# Patient Record
Sex: Female | Born: 1950 | Race: White | Hispanic: No | Marital: Married | Smoking: Former smoker
Health system: Southern US, Community
[De-identification: ages and names within clinical notes are randomized; demographics above are authoritative.]

## PROBLEM LIST (undated history)

## (undated) DIAGNOSIS — J449 Chronic obstructive pulmonary disease, unspecified: Secondary | ICD-10-CM

## (undated) DIAGNOSIS — I639 Cerebral infarction, unspecified: Secondary | ICD-10-CM

## (undated) DIAGNOSIS — I10 Essential (primary) hypertension: Secondary | ICD-10-CM

## (undated) DIAGNOSIS — K219 Gastro-esophageal reflux disease without esophagitis: Secondary | ICD-10-CM

## (undated) DIAGNOSIS — F3181 Bipolar II disorder: Secondary | ICD-10-CM

## (undated) DIAGNOSIS — F0390 Unspecified dementia without behavioral disturbance: Secondary | ICD-10-CM

## (undated) DIAGNOSIS — R29898 Other symptoms and signs involving the musculoskeletal system: Secondary | ICD-10-CM

## (undated) DIAGNOSIS — F209 Schizophrenia, unspecified: Secondary | ICD-10-CM

## (undated) DIAGNOSIS — T753XXA Motion sickness, initial encounter: Secondary | ICD-10-CM

## (undated) DIAGNOSIS — Z85820 Personal history of malignant melanoma of skin: Secondary | ICD-10-CM

## (undated) DIAGNOSIS — E119 Type 2 diabetes mellitus without complications: Secondary | ICD-10-CM

## (undated) HISTORY — DX: Gastro-esophageal reflux disease without esophagitis: K21.9

## (undated) HISTORY — DX: Cerebral infarction, unspecified: I63.9

## (undated) HISTORY — PX: ABDOMINAL HYSTERECTOMY: SHX81

## (undated) HISTORY — PX: COLON SURGERY: SHX602

---

## 2015-04-10 ENCOUNTER — Ambulatory Visit
Admit: 2015-04-10 | Disposition: A | Discharge status: Home / Self Care | Payer: Self-pay | Attending: Family Medicine | Admitting: Family Medicine

## 2015-05-01 ENCOUNTER — Ambulatory Visit
Admission: RE | Admit: 2015-05-01 | Discharge: 2015-05-01 | Disposition: A | Discharge status: Home / Self Care | Payer: Medicare PPO | Source: Ambulatory Visit | Attending: Family Medicine | Admitting: Family Medicine

## 2015-05-01 ENCOUNTER — Other Ambulatory Visit: Payer: Self-pay | Admitting: Family Medicine

## 2015-05-01 DIAGNOSIS — J449 Chronic obstructive pulmonary disease, unspecified: Secondary | ICD-10-CM | POA: Insufficient documentation

## 2015-05-01 DIAGNOSIS — R05 Cough: Secondary | ICD-10-CM

## 2015-05-01 DIAGNOSIS — R059 Cough, unspecified: Secondary | ICD-10-CM

## 2018-09-29 LAB — HM MAMMOGRAPHY

## 2019-01-19 ENCOUNTER — Ambulatory Visit
Admission: EM | Admit: 2019-01-19 | Discharge: 2019-01-19 | Disposition: A | Discharge status: Home / Self Care | Payer: Medicare (Managed Care) | Attending: Family Medicine | Admitting: Family Medicine

## 2019-01-19 ENCOUNTER — Encounter: Payer: Self-pay | Admitting: Emergency Medicine

## 2019-01-19 ENCOUNTER — Other Ambulatory Visit: Payer: Self-pay

## 2019-01-19 ENCOUNTER — Ambulatory Visit (INDEPENDENT_AMBULATORY_CARE_PROVIDER_SITE_OTHER): Payer: Medicare (Managed Care)

## 2019-01-19 DIAGNOSIS — Z87891 Personal history of nicotine dependence: Secondary | ICD-10-CM

## 2019-01-19 DIAGNOSIS — J441 Chronic obstructive pulmonary disease with (acute) exacerbation: Secondary | ICD-10-CM | POA: Diagnosis not present

## 2019-01-19 DIAGNOSIS — R6883 Chills (without fever): Secondary | ICD-10-CM

## 2019-01-19 DIAGNOSIS — R05 Cough: Secondary | ICD-10-CM | POA: Diagnosis not present

## 2019-01-19 DIAGNOSIS — R0989 Other specified symptoms and signs involving the circulatory and respiratory systems: Secondary | ICD-10-CM

## 2019-01-19 DIAGNOSIS — R059 Cough, unspecified: Secondary | ICD-10-CM

## 2019-01-19 HISTORY — DX: Essential (primary) hypertension: I10

## 2019-01-19 HISTORY — DX: Unspecified dementia, unspecified severity, without behavioral disturbance, psychotic disturbance, mood disturbance, and anxiety: F03.90

## 2019-01-19 HISTORY — DX: Chronic obstructive pulmonary disease, unspecified: J44.9

## 2019-01-19 HISTORY — DX: Personal history of malignant melanoma of skin: Z85.820

## 2019-01-19 HISTORY — DX: Schizophrenia, unspecified: F20.9

## 2019-01-19 HISTORY — DX: Bipolar II disorder: F31.81

## 2019-01-19 MED ORDER — DOXYCYCLINE HYCLATE 100 MG PO CAPS: 100.0000 mg | ORAL_CAPSULE | Freq: Two times a day (BID) | ORAL | 0 refills | Status: DC

## 2019-01-19 MED ORDER — PREDNISONE 20 MG PO TABS: ORAL_TABLET | ORAL | 0 refills | Status: DC

## 2019-01-19 MED ORDER — ALBUTEROL SULFATE HFA 108 (90 BASE) MCG/ACT IN AERS: 1.0000 | INHALATION_SPRAY | Freq: Four times a day (QID) | RESPIRATORY_TRACT | 0 refills | Status: AC | PRN

## 2019-01-19 NOTE — ED Provider Notes (Signed)
[facility]    CSN: [id number] Arrival date & time: 01/19/19  1055     History   Chief Complaint Chief Complaint  Patient presents with  . Cough    APPT    HPI Lorraine Vargas is a 68 y.o. female.   HPI  68 year old female presents with a cough that she has had for 1 month.  Now she is complaining also of sinus congestion and drainage.  Cough has been productive of brownish sputum.  She recently quit smoking about 4 months ago.  She has been taking over-the-counter Sudafed and Robitussin without much success.  Her psychiatrist on 01/15/2019 and they prescribed Tessalon Perles and Zyrtec.  Is a history of COPD.  Sure a 98.9 respirations 18 O2 sats 97%.        Past Medical History:  Diagnosis Date  . Bipolar 2 disorder ([place])   . COPD (chronic obstructive pulmonary disease) ([place])   . Dementia ([place])   . History of melanoma   . Hypertension   . Schizophrenia ([place])     There are no active problems to display for this patient.   Past Surgical History:  Procedure Laterality Date  . ABDOMINAL HYSTERECTOMY    . COLON SURGERY      OB History   No obstetric history on file.      Home Medications    Prior to Admission medications   Medication Sig Start Date End Date Taking? Authorizing Provider  ARIPiprazole (ABILIFY) 5 MG tablet Take by mouth. 09/15/18  Yes [provider]  atenolol (TENORMIN) 50 MG tablet Take by mouth. 05/28/18 05/28/19 Yes [provider]  benzonatate (TESSALON) 100 MG capsule Take by mouth at bedtime as needed for cough.   Yes [provider]  buPROPion (WELLBUTRIN XL) 150 MG 24 hr tablet Take by mouth. 07/10/18 07/10/19 Yes [provider]  cetirizine (ZYRTEC) 10 MG tablet Take 10 mg by mouth daily.   Yes [provider]  Dextromethorphan-quiNIDine (NUEDEXTA) 20-10 MG capsule Take 1 capsule by mouth daily. Will increase to bid on 01/22/19   Yes [provider]  losartan (COZAAR) 50  MG tablet Take by mouth. 11/11/18 11/11/19 Yes [provider]  memantine (NAMENDA) 5 MG tablet Take by mouth. 09/17/18  Yes [provider]  mirtazapine (REMERON) 15 MG tablet Mirtazapine 15 MG Oral Tablet   Yes [provider]  rosuvastatin (CRESTOR) 20 MG tablet Take by mouth. 10/15/18 2019/11/12 Yes [provider]  albuterol (PROVENTIL HFA;VENTOLIN HFA) 108 (90 Base) MCG/ACT inhaler Inhale 1-2 puffs into the lungs every 6 (six) hours as needed for wheezing or shortness of breath. Use with spacer 01/19/19   Lorin Picket, PA-C  doxycycline (VIBRAMYCIN) 100 MG capsule Take 1 capsule (100 mg total) by mouth 2 (two) times daily. 01/19/19   Lorin Picket, PA-C  predniSONE (DELTASONE) 20 MG tablet Take 2 tablets (40 mg) daily by mouth 01/19/19   Lorin Picket, PA-C    Family History Family History  Problem Relation Age of Onset  . Dementia Mother   . Congestive Heart Failure Father   . Diabetes Father     Social History Social History   Tobacco Use  . Smoking status: Former Smoker    Last attempt to quit: 09/18/2018    Years since quitting: 0.3  . Smokeless tobacco: Never Used  Substance Use Topics  . Alcohol use: Never    Frequency: Never  . Drug use: Never  Allergies   Amlodipine; Penicillin g; and Lisinopril   Review of Systems Review of Systems  Constitutional: Positive for activity change. Negative for chills, fatigue and fever.  HENT: Positive for congestion, sinus pressure and sinus pain.   Respiratory: Positive for cough and shortness of breath.   All other systems reviewed and are negative.    Physical Exam Triage Vital Signs ED Triage Vitals  Enc Vitals Group     BP 01/19/19 1126 (!) 193/74     Pulse Rate 01/19/19 1126 76     Resp 01/19/19 1126 18     Temp 01/19/19 1126 98.9 F (37.2 C)     Temp Source 01/19/19 1126 Oral     SpO2 01/19/19 1126 97 %     Weight 01/19/19 1127 124 lb (56.2 kg)     Height 01/19/19  1127 5\' 2"  (1.575 m)     Head Circumference --      Peak Flow --      Pain Score 01/19/19 1127 0     Pain Loc --      Pain Edu? --      Excl. in [place]? --    No data found.  Updated Vital Signs BP (!) 193/74 (BP Location: Right Arm)   Pulse 76   Temp 98.9 F (37.2 C) (Oral)   Resp 18   Ht 5\' 2"  (1.575 m)   Wt 124 lb (56.2 kg)   SpO2 97%   BMI 22.68 kg/m   Visual Acuity Right Eye Distance:   Left Eye Distance:   Bilateral Distance:    Right Eye Near:   Left Eye Near:    Bilateral Near:     Physical Exam Vitals signs and nursing note reviewed.  Constitutional:      General: She is not in acute distress.    Appearance: Normal appearance. She is not ill-appearing, toxic-appearing or diaphoretic.  HENT:     Head: Normocephalic and atraumatic.     Right Ear: Tympanic membrane, ear canal and external ear normal.     Left Ear: Tympanic membrane, ear canal and external ear normal.     Nose: Congestion present. No rhinorrhea.     Mouth/Throat:     Mouth: Mucous membranes are moist.     Pharynx: No oropharyngeal exudate or posterior oropharyngeal erythema.  Eyes:     Conjunctiva/sclera: Conjunctivae normal.  Neck:     Musculoskeletal: Normal range of motion and neck supple.  Pulmonary:     Effort: Pulmonary effort is normal.     Breath sounds: Rales present.     Comments: Bibasilar non-tussive crackles Musculoskeletal: Normal range of motion.  Lymphadenopathy:     Cervical: No cervical adenopathy.  Skin:    General: Skin is warm and dry.  Neurological:     General: No focal deficit present.     Mental Status: She is alert and oriented to person, place, and time.  Psychiatric:        Mood and Affect: Mood normal.        Behavior: Behavior normal.        Thought Content: Thought content normal.        Judgment: Judgment normal.      UC Treatments / Results  Labs (all labs ordered are listed, but only abnormal results are displayed) Labs Reviewed - No data to  display  EKG None  Radiology Dg Chest 2 View  Result Date: 01/19/2019 CLINICAL DATA:  Pt states she has been sick  x 2-3 weeks with upper chest tightness, runny nose, prod cough, chills. Hx exsmoker and bronchitis EXAM: CHEST - 2 VIEW COMPARISON:  05/01/2015 FINDINGS: Lungs clear, mildly hyperinflated. Heart size and mediastinal contours are within normal limits. Aortic Atherosclerosis (ICD10-170.0). No effusion. Visualized bones unremarkable. IMPRESSION: No acute cardiopulmonary disease. Electronically Signed   By: Lucrezia Europe M.D.   On: 01/19/2019 12:30    Procedures Procedures (including critical care time)  Medications Ordered in UC Medications - No data to display  Initial Impression / Assessment and Plan / UC Course  I have reviewed the triage vital signs and the nursing notes.  Pertinent labs & imaging results that were available during my care of the patient were reviewed by me and considered in my medical decision making (see chart for details).   Chronic cough with no findings of pneumonia but does have mild bronchitic changes. Place her on albuterol inhalers given a prescription for prednisone that I want her to clear with her psychiatrist before taking.  Place her on doxycycline.  Commended that she follow-up with her primary care physician or pulmonologist for further evaluation and ongoing care.   Final Clinical Impressions(s) / UC Diagnoses   Final diagnoses:  COPD exacerbation ([place])  Cough   Discharge Instructions   None    ED Prescriptions    Medication Sig Dispense Auth. Provider   predniSONE (DELTASONE) 20 MG tablet Take 2 tablets (40 mg) daily by mouth 8 tablet Crecencio Mc P, PA-C   doxycycline (VIBRAMYCIN) 100 MG capsule Take 1 capsule (100 mg total) by mouth 2 (two) times daily. 14 capsule Crecencio Mc P, PA-C   albuterol (PROVENTIL HFA;VENTOLIN HFA) 108 (90 Base) MCG/ACT inhaler Inhale 1-2 puffs into the lungs every 6 (six) hours as needed for  wheezing or shortness of breath. Use with spacer 1 Inhaler Lorin Picket, PA-C     Controlled Substance Prescriptions [place] Controlled Substance Registry consulted? Not Applicable   Lorin Picket, PA-C 01/19/19 1352

## 2019-01-19 NOTE — ED Triage Notes (Signed)
Patient in today c/o cough x 1 month and now sinus congestion. Patient denies fever. Patient has tried OTC Sudafed and Robitussin. Patient also saw psych on 01/15/19 and they gave Tessalon Perles and Zyrtec.

## 2019-05-28 ENCOUNTER — Encounter: Payer: Self-pay | Admitting: Family Medicine

## 2019-05-28 ENCOUNTER — Other Ambulatory Visit: Payer: Self-pay

## 2019-05-28 ENCOUNTER — Ambulatory Visit (INDEPENDENT_AMBULATORY_CARE_PROVIDER_SITE_OTHER): Payer: Medicare Other | Admitting: Family Medicine

## 2019-05-28 VITALS — BP 165/85 | HR 83 | Temp 98.6°F | Ht 62.5 in | Wt 137.0 lb

## 2019-05-28 DIAGNOSIS — J449 Chronic obstructive pulmonary disease, unspecified: Secondary | ICD-10-CM | POA: Insufficient documentation

## 2019-05-28 DIAGNOSIS — E7800 Pure hypercholesterolemia, unspecified: Secondary | ICD-10-CM

## 2019-05-28 DIAGNOSIS — K219 Gastro-esophageal reflux disease without esophagitis: Secondary | ICD-10-CM | POA: Insufficient documentation

## 2019-05-28 DIAGNOSIS — Z114 Encounter for screening for human immunodeficiency virus [HIV]: Secondary | ICD-10-CM

## 2019-05-28 DIAGNOSIS — I10 Essential (primary) hypertension: Secondary | ICD-10-CM

## 2019-05-28 DIAGNOSIS — F0390 Unspecified dementia without behavioral disturbance: Secondary | ICD-10-CM | POA: Diagnosis not present

## 2019-05-28 DIAGNOSIS — E785 Hyperlipidemia, unspecified: Secondary | ICD-10-CM | POA: Insufficient documentation

## 2019-05-28 DIAGNOSIS — F3181 Bipolar II disorder: Secondary | ICD-10-CM | POA: Insufficient documentation

## 2019-05-28 DIAGNOSIS — Z1159 Encounter for screening for other viral diseases: Secondary | ICD-10-CM

## 2019-05-28 DIAGNOSIS — Z85820 Personal history of malignant melanoma of skin: Secondary | ICD-10-CM | POA: Insufficient documentation

## 2019-05-28 LAB — CBC WITH DIFFERENTIAL/PLATELET
Hematocrit: 38.5 % (ref 34.0–46.6)
Hemoglobin: 13 g/dL (ref 11.1–15.9)
Lymphocytes Absolute: 2.8 10*3/uL (ref 0.7–3.1)
Lymphs: 21 %
MCH: 29.1 pg (ref 26.6–33.0)
MCHC: 33.8 g/dL (ref 31.5–35.7)
MCV: 86 fL (ref 79–97)
MID (Absolute): 1.1 10*3/uL (ref 0.1–1.6)
MID: 8 %
Neutrophils Absolute: 9.7 10*3/uL — ABNORMAL HIGH (ref 1.4–7.0)
Neutrophils: 71 %
Platelets: 277 10*3/uL (ref 150–450)
RBC: 4.47 x10E6/uL (ref 3.77–5.28)
RDW: 15.4 % (ref 11.7–15.4)
WBC: 13.6 10*3/uL — ABNORMAL HIGH (ref 3.4–10.8)

## 2019-05-28 MED ORDER — ROSUVASTATIN CALCIUM 20 MG PO TABS: 20.0000 mg | ORAL_TABLET | Freq: Every day | ORAL | 1 refills | Status: DC

## 2019-05-28 MED ORDER — LOSARTAN POTASSIUM 100 MG PO TABS: 100.0000 mg | ORAL_TABLET | Freq: Every day | ORAL | 3 refills | Status: DC

## 2019-05-28 MED ORDER — ATENOLOL 50 MG PO TABS: 50.0000 mg | ORAL_TABLET | Freq: Every day | ORAL | 1 refills | Status: DC

## 2019-05-28 NOTE — Assessment & Plan Note (Signed)
Following with Dr. Phillip Heal. Continue to monitor. Call with any concerns.

## 2019-05-28 NOTE — Assessment & Plan Note (Signed)
Under good control on current regimen. Continue current regimen. Continue to monitor. Call with any concerns. Refills given.   

## 2019-05-28 NOTE — Assessment & Plan Note (Signed)
Rechecking levels today. Continue current regimen. Continue to monitor. Call with any concerns. Refills given today.

## 2019-05-28 NOTE — Progress Notes (Signed)
BP (!) 165/85   Pulse 83   Temp 98.6 F (37 C) (Oral)   Ht 5' 2.5" (1.588 m)   Wt 137 lb (62.1 kg)   SpO2 95%   BMI 24.66 kg/m    Subjective:    Patient ID: Lorraine Vargas, female    DOB: [birth year], 68 y.o.   MRN: [id number]  HPI: Lorraine Vargas is a 68 y.o. female  Chief Complaint  Patient presents with  . Establish Care    pt would like to discuss about her left shoulder pain, middle chest pain and swelling ankles   . Cough   Was seeing a Dr. [place], last saw them in December.  HYPERTENSION / HYPERLIPIDEMIA Satisfied with current treatment? yes Duration of hypertension: chronic BP monitoring frequency: not checking BP medication side effects: no Past BP meds: atenolol, losartan Duration of hyperlipidemia: chronic Cholesterol medication side effects: no Cholesterol supplements: none Past cholesterol medications: crestor Medication compliance: good compliance Aspirin: no Recent stressors: yes Recurrent headaches: yes Visual changes: no Palpitations: no Dyspnea: no Chest pain: no Lower extremity edema: no Dizzy/lightheaded: no  BIPOLAR Mood status: uncontrolled Satisfied with current treatment?: no Symptom severity: moderate  Duration of current treatment : chronic Side effects: no Medication compliance: excellent compliance Psychotherapy/counseling: yes in the past Depressed mood: yes Anxious mood: yes Anhedonia: yes Significant weight loss or gain: no Insomnia: no  Fatigue: yes Feelings of worthlessness or guilt: yes Impaired concentration/indecisiveness: yes Suicidal ideations: no Hopelessness: yes Crying spells: yes GAD 7 : Generalized Anxiety Score 05/28/2019  Nervous, Anxious, on Edge 2  Control/stop worrying 3  Worry too much - different things 3  Trouble relaxing 3  Restless 3  Easily annoyed or irritable 3  Afraid - awful might happen 0  Total GAD 7 Score 17  Anxiety Difficulty Very difficult   Melanoma in June of  Last year. Already hooked in with Dr. Phillip Heal. Getting her treatment. Didn't do a PET scan due to the mini-strokes, so didn't do them. Unsure if she's going to have any PET scans. Good for follow up in 6 months, has been doing OK.  Seeing [employer] [facility] in [place]. They are working on her mood and trying to get it under better control. She is very anxious as she wants to be in assisted living but has lived with her daughter now for the last 3 months due to the COVID-19 pandemic and has not been able to see her mother or her other family members. She is very upset about this.    Has been having pain in her ankle, shoulder and hands, aching and sore. Worse with certain movements and better with heat and showering. Pain radiates into her arm a little bit. She is otherwise feeling well with no other concerns or complaints at this time.   Active Ambulatory Problems    Diagnosis Date Noted  . COPD (chronic obstructive pulmonary disease) ([place])   . GERD (gastroesophageal reflux disease)   . Hypertension   . Dementia ([place])   . Bipolar 2 disorder ([place])   . History of melanoma   . Hyperlipemia 05/28/2019   Resolved Ambulatory Problems    Diagnosis Date Noted  . No Resolved Ambulatory Problems   Past Medical History:  Diagnosis Date  . Schizophrenia ([place])   . Stroke (cerebrum) [facility])    Past Surgical History:  Procedure Laterality Date  . ABDOMINAL HYSTERECTOMY    . COLON SURGERY     Outpatient Encounter Medications as  of 05/28/2019  Medication Sig  . albuterol (PROVENTIL HFA;VENTOLIN HFA) 108 (90 Base) MCG/ACT inhaler Inhale 1-2 puffs into the lungs every 6 (six) hours as needed for wheezing or shortness of breath. Use with spacer  . ARIPiprazole (ABILIFY) 5 MG tablet Take by mouth.  Marland Kitchen atenolol (TENORMIN) 50 MG tablet Take 1 tablet (50 mg total) by mouth daily.  . benzonatate (TESSALON) 100 MG capsule Take by mouth at bedtime as needed for cough.  Marland Kitchen buPROPion (WELLBUTRIN XL) 150 MG 24  hr tablet Take by mouth.  . Dextromethorphan-quiNIDine (NUEDEXTA) 20-10 MG capsule Take 1 capsule by mouth daily. Will increase to bid on 01/22/19  . IBUPROFEN PO Take by mouth as needed. daily  . losartan (COZAAR) 100 MG tablet Take 1 tablet (100 mg total) by mouth daily.  . memantine (NAMENDA) 5 MG tablet Take by mouth.  . mirtazapine (REMERON) 15 MG tablet Mirtazapine 15 MG Oral Tablet  . rosuvastatin (CRESTOR) 20 MG tablet Take 1 tablet (20 mg total) by mouth daily.  . [DISCONTINUED] atenolol (TENORMIN) 50 MG tablet Take by mouth.  . [DISCONTINUED] losartan (COZAAR) 50 MG tablet Take by mouth.  . [DISCONTINUED] rosuvastatin (CRESTOR) 20 MG tablet Take by mouth.  . [DISCONTINUED] cetirizine (ZYRTEC) 10 MG tablet Take 10 mg by mouth daily.  . [DISCONTINUED] doxycycline (VIBRAMYCIN) 100 MG capsule Take 1 capsule (100 mg total) by mouth 2 (two) times daily.  . [DISCONTINUED] predniSONE (DELTASONE) 20 MG tablet Take 2 tablets (40 mg) daily by mouth   No facility-administered encounter medications on file as of 05/28/2019.    Allergies  Allergen Reactions  . Amlodipine Rash  . Penicillin G     Other reaction(s): Vomiting  . Lisinopril     Other reaction(s): Cough   Social History   Socioeconomic History  . Marital status: Married    Spouse name: Not on file  . Number of children: Not on file  . Years of education: Not on file  . Highest education level: Not on file  Occupational History  . Not on file  Social Needs  . Financial resource strain: Not on file  . Food insecurity    Worry: Not on file    Inability: Not on file  . Transportation needs    Medical: Not on file    Non-medical: Not on file  Tobacco Use  . Smoking status: Former Smoker    Quit date: 09/18/2018    Years since quitting: 0.6  . Smokeless tobacco: Never Used  Substance and Sexual Activity  . Alcohol use: Not Currently    Frequency: Never  . Drug use: Never  . Sexual activity: Not on file  Lifestyle  .  Physical activity    Days per week: Not on file    Minutes per session: Not on file  . Stress: Not on file  Relationships  . Social Herbalist on phone: Not on file    Gets together: Not on file    Attends religious service: Not on file    Active member of club or organization: Not on file    Attends meetings of clubs or organizations: Not on file    Relationship status: Not on file  . Intimate partner violence    Fear of current or ex partner: Not on file    Emotionally abused: Not on file    Physically abused: Not on file    Forced sexual activity: Not on file  Other Topics Concern  .  Not on file  Social History Narrative  . Not on file   Family History  Problem Relation Age of Onset  . Dementia Mother   . Congestive Heart Failure Father   . Diabetes Father   . Heart disease Sister     Review of Systems  Constitutional: Negative.   HENT: Negative.   Respiratory: Negative.   Cardiovascular: Negative.   Musculoskeletal: Positive for arthralgias and myalgias. Negative for back pain, gait problem, joint swelling, neck pain and neck stiffness.  Skin: Negative.   Neurological: Positive for headaches. Negative for dizziness, tremors, seizures, syncope, facial asymmetry, speech difficulty, weakness, light-headedness and numbness.  Psychiatric/Behavioral: Positive for dysphoric mood. Negative for agitation, behavioral problems, confusion, decreased concentration, hallucinations, self-injury, sleep disturbance and suicidal ideas. The patient is nervous/anxious. The patient is not hyperactive.     Per HPI unless specifically indicated above     Objective:    BP (!) 165/85   Pulse 83   Temp 98.6 F (37 C) (Oral)   Ht 5' 2.5" (1.588 m)   Wt 137 lb (62.1 kg)   SpO2 95%   BMI 24.66 kg/m   Wt Readings from Last 3 Encounters:  05/28/19 137 lb (62.1 kg)  01/19/19 124 lb (56.2 kg)    Physical Exam Vitals signs and nursing note reviewed.  Constitutional:       General: She is not in acute distress.    Appearance: Normal appearance. She is not ill-appearing, toxic-appearing or diaphoretic.  HENT:     Head: Normocephalic and atraumatic.     Right Ear: External ear normal.     Left Ear: External ear normal.     Nose: Nose normal.     Mouth/Throat:     Mouth: Mucous membranes are moist.     Pharynx: Oropharynx is clear.  Eyes:     General: No scleral icterus.       Right eye: No discharge.        Left eye: No discharge.     Extraocular Movements: Extraocular movements intact.     Conjunctiva/sclera: Conjunctivae normal.     Pupils: Pupils are equal, round, and reactive to light.  Neck:     Musculoskeletal: Normal range of motion and neck supple.  Cardiovascular:     Rate and Rhythm: Normal rate and regular rhythm.     Pulses: Normal pulses.     Heart sounds: Normal heart sounds. No murmur. No friction rub. No gallop.   Pulmonary:     Effort: Pulmonary effort is normal. No respiratory distress.     Breath sounds: Normal breath sounds. No stridor. No wheezing, rhonchi or rales.  Chest:     Chest wall: No tenderness.  Musculoskeletal: Normal range of motion.  Skin:    General: Skin is warm and dry.     Capillary Refill: Capillary refill takes less than 2 seconds.     Coloration: Skin is not jaundiced or pale.     Findings: No bruising, erythema, lesion or rash.  Neurological:     General: No focal deficit present.     Mental Status: She is alert and oriented to person, place, and time. Mental status is at baseline.  Psychiatric:        Mood and Affect: Mood is depressed. Affect is tearful.        Behavior: Behavior normal.        Thought Content: Thought content normal.        Cognition and Memory: Cognition is  impaired.        Judgment: Judgment normal.     Results for orders placed or performed in visit on 05/28/19  Microscopic Examination   URINE  Result Value Ref Range   WBC, UA 0-5 0 - 5 /hpf   RBC None seen 0 - 2 /hpf    Epithelial Cells (non renal) 0-10 0 - 10 /hpf   Bacteria, UA Few (A) None seen/Few  Urine Culture, Reflex   URINE  Result Value Ref Range   Urine Culture, Routine WILL FOLLOW   UA/M w/rflx Culture, Routine   Specimen: Urine   URINE  Result Value Ref Range   Specific Gravity, UA 1.010 1.005 - 1.030   pH, UA 7.0 5.0 - 7.5   Color, UA Yellow Yellow   Appearance Ur Clear Clear   Leukocytes,UA 1+ (A) Negative   Protein,UA Negative Negative/Trace   Glucose, UA Negative Negative   Ketones, UA Negative Negative   RBC, UA Negative Negative   Bilirubin, UA Negative Negative   Urobilinogen, Ur 0.2 0.2 - 1.0 mg/dL   Nitrite, UA Negative Negative   Microscopic Examination See below:    Urinalysis Reflex Comment       Assessment & Plan:   Problem List Items Addressed This Visit      Cardiovascular and Mediastinum   Hypertension - Primary    Not under good control. Will increase her losartan to 100mg  and recheck in 3-4 weeks. Call with any concerns. Continue to monitor. Labs drawn.       Relevant Medications   losartan (COZAAR) 100 MG tablet   rosuvastatin (CRESTOR) 20 MG tablet   atenolol (TENORMIN) 50 MG tablet   Other Relevant Orders   Comprehensive metabolic panel   TSH   UA/M w/rflx Culture, Routine (Completed)   CBC With Differential/Platelet     Respiratory   COPD (chronic obstructive pulmonary disease) ([place])    Under good control on current regimen. Continue current regimen. Continue to monitor. Call with any concerns. Refills given.        Relevant Orders   Comprehensive metabolic panel   UA/M w/rflx Culture, Routine (Completed)   CBC With Differential/Platelet     Digestive   GERD (gastroesophageal reflux disease)    Under good control on current regimen. Continue current regimen. Continue to monitor. Call with any concerns. Refills given.        Relevant Orders   Comprehensive metabolic panel   UA/M w/rflx Culture, Routine (Completed)   CBC With  Differential/Platelet     Nervous and Auditory   Dementia [facility])    Following with psychiatry. Not feeling like herself right now. Will call with any concerns. Continue to monitor.       Relevant Orders   Comprehensive metabolic panel   UA/M w/rflx Culture, Routine (Completed)   CBC With Differential/Platelet     Other   Bipolar 2 disorder [facility])    Following with psychiatry. Not feeling like herself right now. Will call with any concerns. Continue to monitor.       Relevant Orders   Comprehensive metabolic panel   TSH   UA/M w/rflx Culture, Routine (Completed)   CBC With Differential/Platelet   History of melanoma    Following with Dr. Phillip Heal. Continue to monitor. Call with any concerns.       Relevant Orders   Comprehensive metabolic panel   UA/M w/rflx Culture, Routine (Completed)   CBC With Differential/Platelet   Hyperlipemia    Rechecking levels today.  Continue current regimen. Continue to monitor. Call with any concerns. Refills given today.      Relevant Medications   losartan (COZAAR) 100 MG tablet   rosuvastatin (CRESTOR) 20 MG tablet   atenolol (TENORMIN) 50 MG tablet   Other Relevant Orders   Lipid Panel w/o Chol/HDL Ratio   UA/M w/rflx Culture, Routine (Completed)   CBC With Differential/Platelet    Other Visit Diagnoses    Screening for HIV (human immunodeficiency virus)       Labs checked today. Await results.    Relevant Orders   HIV Antibody (routine testing w rflx)   Need for hepatitis C screening test       Labs checked today. Await results.    Relevant Orders   Hepatitis C Antibody       Follow up plan: Return for 3-4 weeks follow up BP.

## 2019-05-28 NOTE — Assessment & Plan Note (Signed)
Not under good control. Will increase her losartan to 100mg  and recheck in 3-4 weeks. Call with any concerns. Continue to monitor. Labs drawn.

## 2019-05-28 NOTE — Assessment & Plan Note (Signed)
Following with psychiatry. Not feeling like herself right now. Will call with any concerns. Continue to monitor.

## 2019-05-29 LAB — LIPID PANEL W/O CHOL/HDL RATIO

## 2019-05-30 LAB — COMPREHENSIVE METABOLIC PANEL
ALT: 9 IU/L (ref 0–32)
AST: 12 IU/L (ref 0–40)
Albumin/Globulin Ratio: 1.1 — ABNORMAL LOW (ref 1.2–2.2)
Albumin: 4.5 g/dL (ref 3.8–4.8)
Alkaline Phosphatase: 116 IU/L (ref 39–117)
BUN/Creatinine Ratio: 12 (ref 12–28)
BUN: 13 mg/dL (ref 8–27)
Bilirubin Total: <0.2 mg/dL (ref 0.0–1.2)
CO2: 17 mmol/L — ABNORMAL LOW (ref 20–29)
Calcium: 10.4 mg/dL — ABNORMAL HIGH (ref 8.7–10.3)
Chloride: 94 mmol/L — ABNORMAL LOW (ref 96–106)
Creatinine, Ser: 1.08 mg/dL — ABNORMAL HIGH (ref 0.57–1.00)
GFR calc Af Amer: 61 mL/min/{1.73_m2} (ref >59)
GFR calc non Af Amer: 53 mL/min/{1.73_m2} — ABNORMAL LOW (ref >59)
Globulin, Total: 4.2 g/dL (ref 1.5–4.5)
Glucose: 214 mg/dL — ABNORMAL HIGH (ref 65–99)
Potassium: 4.3 mmol/L (ref 3.5–5.2)
Sodium: 134 mmol/L (ref 134–144)
Total Protein: 8.7 g/dL — ABNORMAL HIGH (ref 6.0–8.5)

## 2019-05-30 LAB — UA/M W/RFLX CULTURE, ROUTINE
Bilirubin, UA: NEGATIVE
Glucose, UA: NEGATIVE
Ketones, UA: NEGATIVE
Nitrite, UA: NEGATIVE
Protein,UA: NEGATIVE
RBC, UA: NEGATIVE
Specific Gravity, UA: 1.01 (ref 1.005–1.030)
Urobilinogen, Ur: 0.2 mg/dL (ref 0.2–1.0)
pH, UA: 7 (ref 5.0–7.5)

## 2019-05-30 LAB — MICROSCOPIC EXAMINATION: RBC, Urine: NONE SEEN /hpf (ref 0–2)

## 2019-05-30 LAB — LIPID PANEL W/O CHOL/HDL RATIO
Cholesterol, Total: 120 mg/dL (ref 100–199)
HDL: 45 mg/dL (ref >39)
LDL Calculated: 31 mg/dL (ref 0–99)
Triglycerides: 218 mg/dL — ABNORMAL HIGH (ref 0–149)
VLDL Cholesterol Cal: 44 mg/dL — ABNORMAL HIGH (ref 5–40)

## 2019-05-30 LAB — URINE CULTURE, REFLEX: Organism ID, Bacteria: NO GROWTH

## 2019-05-30 LAB — TSH: TSH: 4.94 u[IU]/mL — ABNORMAL HIGH (ref 0.450–4.500)

## 2019-05-30 LAB — HIV ANTIBODY (ROUTINE TESTING W REFLEX): HIV Screen 4th Generation wRfx: NONREACTIVE

## 2019-05-30 LAB — HEPATITIS C ANTIBODY: Hep C Virus Ab: <0.1 s/co ratio (ref 0.0–0.9)

## 2019-06-04 ENCOUNTER — Telehealth: Payer: Self-pay | Admitting: Family Medicine

## 2019-06-04 DIAGNOSIS — R7989 Other specified abnormal findings of blood chemistry: Secondary | ICD-10-CM

## 2019-06-04 DIAGNOSIS — R739 Hyperglycemia, unspecified: Secondary | ICD-10-CM

## 2019-06-04 NOTE — Telephone Encounter (Signed)
Patient's daughter notified.

## 2019-06-04 NOTE — Telephone Encounter (Addendum)
Please let her daughter know that her labs look nice and normal, but her sugar and thyroid were off a little bit. I'd like her to come back just for a thyroid check in 1 month and we'll recheck it. Everything else looks good. Thanks!

## 2019-06-17 ENCOUNTER — Encounter: Payer: Self-pay | Admitting: Family Medicine

## 2019-06-17 ENCOUNTER — Other Ambulatory Visit: Payer: Self-pay

## 2019-06-17 ENCOUNTER — Ambulatory Visit (INDEPENDENT_AMBULATORY_CARE_PROVIDER_SITE_OTHER): Payer: Medicare Other | Admitting: Family Medicine

## 2019-06-17 VITALS — BP 152/84 | HR 80 | Temp 98.0°F | Ht 63.0 in | Wt 135.0 lb

## 2019-06-17 DIAGNOSIS — I10 Essential (primary) hypertension: Secondary | ICD-10-CM

## 2019-06-17 DIAGNOSIS — S81801A Unspecified open wound, right lower leg, initial encounter: Secondary | ICD-10-CM

## 2019-06-17 DIAGNOSIS — Z1239 Encounter for other screening for malignant neoplasm of breast: Secondary | ICD-10-CM | POA: Diagnosis not present

## 2019-06-17 DIAGNOSIS — Z780 Asymptomatic menopausal state: Secondary | ICD-10-CM

## 2019-06-17 DIAGNOSIS — Z23 Encounter for immunization: Secondary | ICD-10-CM

## 2019-06-17 DIAGNOSIS — Z1211 Encounter for screening for malignant neoplasm of colon: Secondary | ICD-10-CM | POA: Diagnosis not present

## 2019-06-17 MED ORDER — GABAPENTIN 100 MG PO CAPS: 100.0000 mg | ORAL_CAPSULE | Freq: Every day | ORAL | 3 refills | Status: DC

## 2019-06-17 MED ORDER — HYDROCHLOROTHIAZIDE 25 MG PO TABS: 25.0000 mg | ORAL_TABLET | Freq: Every day | ORAL | 3 refills | Status: DC

## 2019-06-17 NOTE — Progress Notes (Signed)
BP (!) 152/84   Pulse 80   Temp 98 F (36.7 C) (Oral)   Ht 5\' 3"  (1.6 m)   Wt 135 lb (61.2 kg)   SpO2 96%   BMI 23.91 kg/m    Subjective:    Patient ID: Lorraine Vargas, female    DOB: [birth year], 68 y.o.   MRN: [id number]  HPI: Lorraine Vargas is a 68 y.o. female  Chief Complaint  Patient presents with  . Hypertension   HYPERTENSION Hypertension status: better  Satisfied with current treatment? no Duration of hypertension: chronic BP monitoring frequency:  not checking BP medication side effects:  no Medication compliance: excellent compliance Previous BP meds:losartan, atenolol Aspirin: no Recurrent headaches: no Visual changes: no Palpitations: no Dyspnea: no Chest pain: no Lower extremity edema: no Dizzy/lightheaded: no  Relevant past medical, surgical, family and social history reviewed and updated as indicated. Interim medical history since our last visit reviewed. Allergies and medications reviewed and updated.  Review of Systems  Constitutional: Negative.   Respiratory: Negative.   Cardiovascular: Negative.   Neurological: Negative.   Psychiatric/Behavioral: Negative.     Per HPI unless specifically indicated above     Objective:    BP (!) 152/84   Pulse 80   Temp 98 F (36.7 C) (Oral)   Ht 5\' 3"  (1.6 m)   Wt 135 lb (61.2 kg)   SpO2 96%   BMI 23.91 kg/m   Wt Readings from Last 3 Encounters:  06/17/19 135 lb (61.2 kg)  05/28/19 137 lb (62.1 kg)  01/19/19 124 lb (56.2 kg)    Physical Exam Vitals signs and nursing note reviewed.  Constitutional:      General: She is not in acute distress.    Appearance: Normal appearance. She is not ill-appearing, toxic-appearing or diaphoretic.  HENT:     Head: Normocephalic and atraumatic.     Right Ear: External ear normal.     Left Ear: External ear normal.     Nose: Nose normal.     Mouth/Throat:     Mouth: Mucous membranes are moist.     Pharynx: Oropharynx is clear.  Eyes:   General: No scleral icterus.       Right eye: No discharge.        Left eye: No discharge.     Extraocular Movements: Extraocular movements intact.     Conjunctiva/sclera: Conjunctivae normal.     Pupils: Pupils are equal, round, and reactive to light.  Neck:     Musculoskeletal: Normal range of motion and neck supple.  Cardiovascular:     Rate and Rhythm: Normal rate and regular rhythm.     Pulses: Normal pulses.     Heart sounds: Normal heart sounds. No murmur. No friction rub. No gallop.   Pulmonary:     Effort: Pulmonary effort is normal. No respiratory distress.     Breath sounds: Normal breath sounds. No stridor. No wheezing, rhonchi or rales.  Chest:     Chest wall: No tenderness.  Musculoskeletal: Normal range of motion.  Skin:    General: Skin is warm and dry.     Capillary Refill: Capillary refill takes less than 2 seconds.     Coloration: Skin is not jaundiced or pale.     Findings: No bruising, erythema, lesion or rash.     Comments: Small, well healing wound on R shin, consistent with bug bite  Neurological:     General: No focal deficit present.  Mental Status: She is alert and oriented to person, place, and time. Mental status is at baseline.  Psychiatric:        Mood and Affect: Mood normal.        Behavior: Behavior normal.        Thought Content: Thought content normal.        Judgment: Judgment normal.     Results for orders placed or performed in visit on 05/28/19  Microscopic Examination   URINE  Result Value Ref Range   WBC, UA 0-5 0 - 5 /hpf   RBC None seen 0 - 2 /hpf   Epithelial Cells (non renal) 0-10 0 - 10 /hpf   Bacteria, UA Few (A) None seen/Few  Urine Culture, Reflex   URINE  Result Value Ref Range   Urine Culture, Routine Final report    Organism ID, Bacteria No growth   Comprehensive metabolic panel  Result Value Ref Range   Glucose 214 (H) 65 - 99 mg/dL   BUN 13 8 - 27 mg/dL   Creatinine, Ser 1.08 (H) 0.57 - 1.00 mg/dL   GFR  calc non Af Amer 53 (L) >59 mL/min/1.73   GFR calc Af Amer 61 >59 mL/min/1.73   BUN/Creatinine Ratio 12 12 - 28   Sodium 134 134 - 144 mmol/L   Potassium 4.3 3.5 - 5.2 mmol/L   Chloride 94 (L) 96 - 106 mmol/L   CO2 17 (L) 20 - 29 mmol/L   Calcium 10.4 (H) 8.7 - 10.3 mg/dL   Total Protein 8.7 (H) 6.0 - 8.5 g/dL   Albumin 4.5 3.8 - 4.8 g/dL   Globulin, Total 4.2 1.5 - 4.5 g/dL   Albumin/Globulin Ratio 1.1 (L) 1.2 - 2.2   Bilirubin Total <0.2 0.0 - 1.2 mg/dL   Alkaline Phosphatase 116 39 - 117 IU/L   AST 12 0 - 40 IU/L   ALT 9 0 - 32 IU/L  Lipid Panel w/o Chol/HDL Ratio  Result Value Ref Range   Cholesterol, Total 120 100 - 199 mg/dL   Triglycerides 218 (H) 0 - 149 mg/dL   HDL 45 >39 mg/dL   VLDL Cholesterol Cal 44 (H) 5 - 40 mg/dL   LDL Calculated 31 0 - 99 mg/dL  TSH  Result Value Ref Range   TSH 4.940 (H) 0.450 - 4.500 uIU/mL  UA/M w/rflx Culture, Routine   Specimen: Urine   URINE  Result Value Ref Range   Specific Gravity, UA 1.010 1.005 - 1.030   pH, UA 7.0 5.0 - 7.5   Color, UA Yellow Yellow   Appearance Ur Clear Clear   Leukocytes,UA 1+ (A) Negative   Protein,UA Negative Negative/Trace   Glucose, UA Negative Negative   Ketones, UA Negative Negative   RBC, UA Negative Negative   Bilirubin, UA Negative Negative   Urobilinogen, Ur 0.2 0.2 - 1.0 mg/dL   Nitrite, UA Negative Negative   Microscopic Examination See below:    Urinalysis Reflex Comment   HIV Antibody (routine testing w rflx)  Result Value Ref Range   HIV Screen 4th Generation wRfx Non Reactive Non Reactive  Hepatitis C Antibody  Result Value Ref Range   Hep C Virus Ab <0.1 0.0 - 0.9 s/co ratio  CBC With Differential/Platelet  Result Value Ref Range   WBC 13.6 (H) 3.4 - 10.8 x10E3/uL   RBC 4.47 3.77 - 5.28 x10E6/uL   Hemoglobin 13.0 11.1 - 15.9 g/dL   Hematocrit 38.5 34.0 - 46.6 %   MCV  86 79 - 97 fL   MCH 29.1 26.6 - 33.0 pg   MCHC 33.8 31.5 - 35.7 g/dL   RDW 15.4 11.7 - 15.4 %   Platelets 277  150 - 450 x10E3/uL   Neutrophils 71 Not Estab. %   Lymphs 21 Not Estab. %   MID 8 Not Estab. %   Neutrophils Absolute 9.7 (H) 1.4 - 7.0 x10E3/uL   Lymphocytes Absolute 2.8 0.7 - 3.1 x10E3/uL   MID (Absolute) 1.1 0.1 - 1.6 X10E3/uL      Assessment & Plan:   Problem List Items Addressed This Visit      Cardiovascular and Mediastinum   Hypertension - Primary    Will add HCTZ and continue other meds. Recheck in about 2-3 weeks. Call with any concerns.       Relevant Medications   hydrochlorothiazide (HYDRODIURIL) 25 MG tablet    Other Visit Diagnoses    Colon cancer screening       Referral to GI made today. Call with any concerns.    Relevant Orders   Ambulatory referral to Gastroenterology   Breast cancer screening       Mammogram ordered. Await results.   Relevant Orders   MM DIGITAL SCREENING BILATERAL   Open wound of right lower leg, initial encounter       Healing well. Due for Td. Given today;   Postmenopausal estrogen deficiency       DEXA ordered. Await results.   Relevant Orders   DG Bone Density       Follow up plan: Return in about 2 weeks (around 07/01/2019).

## 2019-06-19 ENCOUNTER — Encounter: Payer: Self-pay | Admitting: Family Medicine

## 2019-06-19 NOTE — Assessment & Plan Note (Signed)
Will add HCTZ and continue other meds. Recheck in about 2-3 weeks. Call with any concerns.

## 2019-06-29 ENCOUNTER — Telehealth: Payer: Self-pay | Admitting: Family Medicine

## 2019-06-29 NOTE — Telephone Encounter (Signed)
Pt daughter called and stated that no one has called to schedule imaging. Daughter states that the pt is still in pain. Please advise

## 2019-07-04 NOTE — Progress Notes (Signed)
BP 127/72   Pulse 80   Temp 98.1 F (36.7 C)   SpO2 96%    Subjective:    Patient ID: Lorraine Vargas, female    DOB: 1951-01-02, 68 y.o.   MRN: [id number]  HPI: Lorraine Vargas is a 68 y.o. female  Chief Complaint  Patient presents with  . Hypertension   HYPERTENSION Hypertension status: better  Satisfied with current treatment? 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 Has been taking gabapentin, but doesn't seem to be getting any better. No other concerns or complaints at this time.   Relevant past medical, surgical, family and social history reviewed and updated as indicated. Interim medical history since our last visit reviewed. Allergies and medications reviewed and updated.  Review of Systems  Constitutional: Negative.   Respiratory: Negative.   Cardiovascular: Negative.   Musculoskeletal: Positive for myalgias. Negative for arthralgias, back pain, gait problem, joint swelling, neck pain and neck stiffness.  Skin: Negative.   Neurological: Positive for numbness. Negative for dizziness, tremors, seizures, syncope, facial asymmetry, speech difficulty, weakness, light-headedness and headaches.  Hematological: Negative.   Psychiatric/Behavioral: Negative.     Per HPI unless specifically indicated above     Objective:    BP 127/72   Pulse 80   Temp 98.1 F (36.7 C)   SpO2 96%   Wt Readings from Last 3 Encounters:  06/17/19 135 lb (61.2 kg)  05/28/19 137 lb (62.1 kg)  01/19/19 124 lb (56.2 kg)    Physical Exam Vitals signs and nursing note reviewed.  Constitutional:    General: She is not in acute distress.    Appearance: Normal appearance. She is not ill-appearing, toxic-appearing or diaphoretic.  HENT:     Head: Normocephalic and atraumatic.     Right Ear: External ear normal.     Left Ear: External ear normal.     Nose: Nose normal.     Mouth/Throat:     Mouth: Mucous membranes are moist.     Pharynx: Oropharynx is clear.  Eyes:     General: No scleral icterus.       Right eye: No discharge.        Left eye: No discharge.     Extraocular Movements: Extraocular movements intact.     Conjunctiva/sclera: Conjunctivae normal.     Pupils: Pupils are equal, round, and reactive to light.  Neck:     Musculoskeletal: Normal range of motion and neck supple.  Cardiovascular:     Rate and Rhythm: Normal rate and regular rhythm.     Pulses: Normal pulses.     Heart sounds: Normal heart sounds. No murmur. No friction rub. No gallop.   Pulmonary:     Effort: Pulmonary effort is normal. No respiratory distress.     Breath sounds: Normal breath sounds. No stridor. No wheezing, rhonchi or rales.  Chest:     Chest wall: No tenderness.  Musculoskeletal: Normal range of motion.        General: Tenderness (L arm) present.  Skin:    General: Skin is warm and dry.     Capillary Refill: Capillary refill takes less than 2 seconds.  Coloration: Skin is not jaundiced or pale.     Findings: No bruising, erythema, lesion or rash.  Neurological:     General: No focal deficit present.     Mental Status: She is alert and oriented to person, place, and time. Mental status is at baseline.  Psychiatric:        Mood and Affect: Mood normal.        Behavior: Behavior normal.        Thought Content: Thought content normal.        Judgment: Judgment normal.     Results for orders placed or performed in visit on 05/28/19  Microscopic Examination   URINE  Result Value Ref Range   WBC, UA 0-5 0 - 5 /hpf   RBC None seen 0 - 2 /hpf   Epithelial Cells (non renal) 0-10  0 - 10 /hpf   Bacteria, UA Few (A) None seen/Few  Urine Culture, Reflex   URINE  Result Value Ref Range   Urine Culture, Routine Final report    Organism ID, Bacteria No growth   Comprehensive metabolic panel  Result Value Ref Range   Glucose 214 (H) 65 - 99 mg/dL   BUN 13 8 - 27 mg/dL   Creatinine, Ser 1.08 (H) 0.57 - 1.00 mg/dL   GFR calc non Af Amer 53 (L) >59 mL/min/1.73   GFR calc Af Amer 61 >59 mL/min/1.73   BUN/Creatinine Ratio 12 12 - 28   Sodium 134 134 - 144 mmol/L   Potassium 4.3 3.5 - 5.2 mmol/L   Chloride 94 (L) 96 - 106 mmol/L   CO2 17 (L) 20 - 29 mmol/L   Calcium 10.4 (H) 8.7 - 10.3 mg/dL   Total Protein 8.7 (H) 6.0 - 8.5 g/dL   Albumin 4.5 3.8 - 4.8 g/dL   Globulin, Total 4.2 1.5 - 4.5 g/dL   Albumin/Globulin Ratio 1.1 (L) 1.2 - 2.2   Bilirubin Total <0.2 0.0 - 1.2 mg/dL   Alkaline Phosphatase 116 39 - 117 IU/L   AST 12 0 - 40 IU/L   ALT 9 0 - 32 IU/L  Lipid Panel w/o Chol/HDL Ratio  Result Value Ref Range   Cholesterol, Total 120 100 - 199 mg/dL   Triglycerides 218 (H) 0 - 149 mg/dL   HDL 45 >39 mg/dL   VLDL Cholesterol Cal 44 (H) 5 - 40 mg/dL   LDL Calculated 31 0 - 99 mg/dL  TSH  Result Value Ref Range   TSH 4.940 (H) 0.450 - 4.500 uIU/mL  UA/M w/rflx Culture, Routine   Specimen: Urine   URINE  Result Value Ref Range   Specific Gravity, UA 1.010 1.005 - 1.030   pH, UA 7.0 5.0 - 7.5   Color, UA Yellow Yellow   Appearance Ur Clear Clear   Leukocytes,UA 1+ (A) Negative   Protein,UA Negative Negative/Trace   Glucose, UA Negative Negative   Ketones, UA Negative Negative   RBC, UA Negative Negative   Bilirubin, UA Negative Negative   Urobilinogen, Ur 0.2 0.2 - 1.0 mg/dL   Nitrite, UA Negative Negative   Microscopic Examination See below:    Urinalysis Reflex Comment   HIV Antibody (routine testing w rflx)  Result Value Ref Range   HIV Screen 4th Generation wRfx Non Reactive Non Reactive  Hepatitis C Antibody  Result Value Ref Range   Hep C  Virus Ab <0.1 0.0 - 0.9 s/co ratio  CBC With Differential/Platelet  Result Value Ref Range  WBC 13.6 (H) 3.4 - 10.8 x10E3/uL   RBC 4.47 3.77 - 5.28 x10E6/uL   Hemoglobin 13.0 11.1 - 15.9 g/dL   Hematocrit 38.5 34.0 - 46.6 %   MCV 86 79 - 97 fL   MCH 29.1 26.6 - 33.0 pg   MCHC 33.8 31.5 - 35.7 g/dL   RDW 15.4 11.7 - 15.4 %   Platelets 277 150 - 450 x10E3/uL   Neutrophils 71 Not Estab. %   Lymphs 21 Not Estab. %   MID 8 Not Estab. %   Neutrophils Absolute 9.7 (H) 1.4 - 7.0 x10E3/uL   Lymphocytes Absolute 2.8 0.7 - 3.1 x10E3/uL   MID (Absolute) 1.1 0.1 - 1.6 X10E3/uL      Assessment & Plan:   Problem List Items Addressed This Visit      Cardiovascular and Mediastinum   Hypertension - Primary    Under good control on current regimen. Continue current regimen. Continue to monitor. Call with any concerns. Refills given. Labs drawn today.       Relevant Medications   losartan (COZAAR) 100 MG tablet   hydrochlorothiazide (HYDRODIURIL) 25 MG tablet   Other Relevant Orders   Basic metabolic panel     Other   Neuralgia    Due to surgery for melanoma. Will increase her gabapentin to 100mg  TID and recheck 1 month. Adjust medicine as needed. Call with any concerns.           Follow up plan: Return in about 4 weeks (around 08/02/2019) for follow up arm pain.

## 2019-07-05 ENCOUNTER — Encounter: Payer: Self-pay | Admitting: Family Medicine

## 2019-07-05 ENCOUNTER — Other Ambulatory Visit: Payer: Self-pay

## 2019-07-05 ENCOUNTER — Other Ambulatory Visit: Payer: Medicare (Managed Care)

## 2019-07-05 ENCOUNTER — Ambulatory Visit (INDEPENDENT_AMBULATORY_CARE_PROVIDER_SITE_OTHER): Payer: Medicare Other | Admitting: Family Medicine

## 2019-07-05 VITALS — BP 127/72 | HR 80 | Temp 98.1°F

## 2019-07-05 DIAGNOSIS — I10 Essential (primary) hypertension: Secondary | ICD-10-CM | POA: Diagnosis not present

## 2019-07-05 DIAGNOSIS — M792 Neuralgia and neuritis, unspecified: Secondary | ICD-10-CM

## 2019-07-05 MED ORDER — LOSARTAN POTASSIUM 100 MG PO TABS: 100.0000 mg | ORAL_TABLET | Freq: Every day | ORAL | 1 refills | Status: DC

## 2019-07-05 MED ORDER — GABAPENTIN 100 MG PO CAPS: 100.0000 mg | ORAL_CAPSULE | Freq: Three times a day (TID) | ORAL | 3 refills | Status: DC

## 2019-07-05 MED ORDER — HYDROCHLOROTHIAZIDE 25 MG PO TABS: 25.0000 mg | ORAL_TABLET | Freq: Every day | ORAL | 1 refills | Status: DC

## 2019-07-05 NOTE — Assessment & Plan Note (Signed)
Due to surgery for melanoma. Will increase her gabapentin to 100mg  TID and recheck 1 month. Adjust medicine as needed. Call with any concerns.

## 2019-07-05 NOTE — Assessment & Plan Note (Signed)
Under good control on current regimen. Continue current regimen. Continue to monitor. Call with any concerns. Refills given. Labs drawn today.   

## 2019-07-06 LAB — BASIC METABOLIC PANEL
BUN/Creatinine Ratio: 21 (ref 12–28)
BUN: 21 mg/dL (ref 8–27)
CO2: 16 mmol/L — ABNORMAL LOW (ref 20–29)
Calcium: 9.5 mg/dL (ref 8.7–10.3)
Chloride: 91 mmol/L — ABNORMAL LOW (ref 96–106)
Creatinine, Ser: 0.98 mg/dL (ref 0.57–1.00)
GFR calc Af Amer: 69 mL/min/{1.73_m2} (ref >59)
GFR calc non Af Amer: 60 mL/min/{1.73_m2} (ref >59)
Glucose: 401 mg/dL — ABNORMAL HIGH (ref 65–99)
Potassium: 4.5 mmol/L (ref 3.5–5.2)
Sodium: 131 mmol/L — ABNORMAL LOW (ref 134–144)

## 2019-07-07 ENCOUNTER — Telehealth: Payer: Self-pay | Admitting: Family Medicine

## 2019-07-07 NOTE — Telephone Encounter (Signed)
Can you please let her/her daughter know that her kidneys came back looking good, but her sugar was very high. I'd like her to come in to get an a1c- order is in. Just needs a lab appointment. Thanks!

## 2019-07-07 NOTE — Telephone Encounter (Signed)
Patient's daughter notified.

## 2019-07-09 ENCOUNTER — Other Ambulatory Visit: Payer: Medicare (Managed Care)

## 2019-07-09 ENCOUNTER — Other Ambulatory Visit: Payer: Self-pay

## 2019-07-09 DIAGNOSIS — Z1239 Encounter for other screening for malignant neoplasm of breast: Secondary | ICD-10-CM

## 2019-07-09 DIAGNOSIS — R739 Hyperglycemia, unspecified: Secondary | ICD-10-CM

## 2019-07-09 DIAGNOSIS — R7989 Other specified abnormal findings of blood chemistry: Secondary | ICD-10-CM

## 2019-07-09 LAB — BAYER DCA HB A1C WAIVED: HB A1C (BAYER DCA - WAIVED): 9.2 % — ABNORMAL HIGH (ref <7.0)

## 2019-07-10 LAB — TSH: TSH: 3.77 u[IU]/mL (ref 0.450–4.500)

## 2019-07-14 ENCOUNTER — Encounter: Payer: Self-pay | Admitting: *Deleted

## 2019-07-19 ENCOUNTER — Telehealth: Payer: Self-pay | Admitting: Family Medicine

## 2019-07-19 DIAGNOSIS — E119 Type 2 diabetes mellitus without complications: Secondary | ICD-10-CM

## 2019-07-19 MED ORDER — METFORMIN HCL ER 500 MG PO TB24: 500.0000 mg | ORAL_TABLET | Freq: Two times a day (BID) | ORAL | 3 refills | Status: DC

## 2019-07-19 MED ORDER — GABAPENTIN 100 MG PO CAPS: 300.0000 mg | ORAL_CAPSULE | Freq: Three times a day (TID) | ORAL | 3 refills | Status: DC

## 2019-07-19 NOTE — Telephone Encounter (Signed)
As long as she is not getting too sleepy on it, she can increase her gabapentin to 300mg  TID- slowly increasing as able. (New Rx sent over to her pharmacy)  Please let her know that her thyroid recheck came back normal, so no need for medicine there yet, we'll just keep an eye on it. However, her sugar came back in the diabetes range. I've sent through some medicine for her and I'd like to see her in a month to see how she's tolerating it.

## 2019-07-19 NOTE — Telephone Encounter (Signed)
Patient's daughter notified. Appointment scheduled.

## 2019-07-19 NOTE — Telephone Encounter (Signed)
Copied from [place] [id number]. Topic: General - Other >> Jul 14, 2019 11:33 AM Keene Breath wrote: Reason for CRM: Tammy, patient's care giver, called to ask the nurse to call regarding the patient's pain medication.  She feels that the pain medication is not helping the patient as well.  Please call to discuss at [phone]

## 2019-07-26 ENCOUNTER — Telehealth: Payer: Self-pay | Admitting: Family Medicine

## 2019-07-26 NOTE — Telephone Encounter (Signed)
Pts daughter called stating even after increasing the medication Dextromethorphan-quiNIDine (NUEDEXTA) 20-10 MG capsule, pt is still in a lot of pain and counting down the time that she can take her next ibuprofen. Please advise.

## 2019-07-26 NOTE — Telephone Encounter (Signed)
Pt scheduled to see Jolene tomorrow for pain management.

## 2019-07-27 ENCOUNTER — Other Ambulatory Visit: Payer: Self-pay | Admitting: Nurse Practitioner

## 2019-07-27 ENCOUNTER — Encounter: Payer: Self-pay | Admitting: Nurse Practitioner

## 2019-07-27 ENCOUNTER — Ambulatory Visit
Admission: RE | Admit: 2019-07-27 | Discharge: 2019-07-27 | Disposition: A | Discharge status: Home / Self Care | Payer: Medicare Other | Attending: Nurse Practitioner | Admitting: Nurse Practitioner

## 2019-07-27 ENCOUNTER — Ambulatory Visit
Admission: RE | Admit: 2019-07-27 | Discharge: 2019-07-27 | Disposition: A | Discharge status: Home / Self Care | Payer: Medicare Other | Source: Ambulatory Visit | Attending: Nurse Practitioner | Admitting: Nurse Practitioner

## 2019-07-27 ENCOUNTER — Ambulatory Visit (INDEPENDENT_AMBULATORY_CARE_PROVIDER_SITE_OTHER): Payer: Medicare Other | Admitting: Nurse Practitioner

## 2019-07-27 ENCOUNTER — Other Ambulatory Visit: Payer: Self-pay

## 2019-07-27 VITALS — BP 151/83 | HR 91 | Temp 97.9°F | Ht 63.0 in | Wt 131.0 lb

## 2019-07-27 DIAGNOSIS — M792 Neuralgia and neuritis, unspecified: Secondary | ICD-10-CM

## 2019-07-27 MED ORDER — DOXYCYCLINE HYCLATE 100 MG PO TABS: 100.0000 mg | ORAL_TABLET | Freq: Two times a day (BID) | ORAL | 0 refills | Status: AC

## 2019-07-27 MED ORDER — GABAPENTIN 400 MG PO CAPS: 400.0000 mg | ORAL_CAPSULE | Freq: Three times a day (TID) | ORAL | 1 refills | Status: DC

## 2019-07-27 MED ORDER — TRAMADOL HCL 50 MG PO TABS: 25.0000 mg | ORAL_TABLET | Freq: Two times a day (BID) | ORAL | 0 refills | Status: AC | PRN

## 2019-07-27 NOTE — Progress Notes (Signed)
BP (!) 151/83   Pulse 91   Temp 97.9 F (36.6 C) (Oral)   Ht 5\' 3"  (1.6 m)   Wt 131 lb (59.4 kg)   SpO2 94%   BMI 23.21 kg/m    Subjective:    Patient ID: Lorraine Vargas, female    DOB: [birth year], 68 y.o.   MRN: [id number]  HPI: Lorraine Vargas is a 68 y.o. female  Chief Complaint  Patient presents with  . Pain    pt states that gabapentin is not helping much   PAIN TO LEFT ARM:   Had surgery last June, Melanoma left arm.  Was started on Gabapentin in past, this was increased to 300 MG TID.  Her daughter is present at bedside to assist in HPI and ROS, patient with dementia.  Both report no improvement in pain level with increase in Gabapentin.  Area specifically to lower bicep. Duration: months, around May it started Location: left, behind elbow  Mechanism of injury: unknown Onset: gradual Severity: 8/10 at worst Quality:  sharp Frequency: intermittent all throughout the day, worse in the evening Radiation: yes, radiates at times up to left shoulder Aggravating factors: nothing she can think of  Alleviating factors: nothing  Status: stable Treatments attempted: Gabapentin and ibuprofen  Relief with NSAIDs?:  moderate Swelling: no Redness: no  Warmth: no Trauma: no Chest pain: no  Shortness of breath: no  Fever: no Decreased sensation: no Paresthesias: no Weakness: no   CRCL 52.24  Relevant past medical, surgical, family and social history reviewed and updated as indicated. Interim medical history since our last visit reviewed. Allergies and medications reviewed and updated.  Review of Systems  Constitutional: Negative for activity change, appetite change, diaphoresis, fatigue and fever.  Respiratory: Negative for cough, chest tightness and shortness of breath.   Cardiovascular: Negative for chest pain, palpitations and leg swelling.  Gastrointestinal: Negative for abdominal distention, abdominal pain, constipation, diarrhea, nausea and vomiting.   Musculoskeletal: Positive for arthralgias.  Neurological: Negative for dizziness, syncope, weakness, light-headedness, numbness and headaches.  Psychiatric/Behavioral: Negative.     Per HPI unless specifically indicated above     Objective:    BP (!) 151/83   Pulse 91   Temp 97.9 F (36.6 C) (Oral)   Ht 5\' 3"  (1.6 m)   Wt 131 lb (59.4 kg)   SpO2 94%   BMI 23.21 kg/m   Wt Readings from Last 3 Encounters:  07/27/19 131 lb (59.4 kg)  06/17/19 135 lb (61.2 kg)  05/28/19 137 lb (62.1 kg)    Physical Exam Vitals signs and nursing note reviewed.  Constitutional:      General: She is awake. She is not in acute distress.    Appearance: She is well-developed. She is not ill-appearing.     Comments: Patient tearful, holding outer left arm, and moaning intermittently in pain while walking around room.  HENT:     Head: Normocephalic.     Right Ear: Hearing normal.     Left Ear: Hearing normal.     Nose: Nose normal.     Mouth/Throat:     Mouth: Mucous membranes are moist.  Eyes:     General: Lids are normal.        Right eye: No discharge.        Left eye: No discharge.     Conjunctiva/sclera: Conjunctivae normal.     Pupils: Pupils are equal, round, and reactive to light.  Neck:     Musculoskeletal:  Normal range of motion and neck supple.  Cardiovascular:     Rate and Rhythm: Normal rate and regular rhythm.     Heart sounds: Normal heart sounds. No murmur. No gallop.   Pulmonary:     Effort: Pulmonary effort is normal. No accessory muscle usage or respiratory distress.     Breath sounds: Normal breath sounds.  Abdominal:     General: Bowel sounds are normal.     Palpations: Abdomen is soft.  Musculoskeletal:     Right upper arm: She exhibits no tenderness, no bony tenderness, no swelling, no edema and no laceration.     Left upper arm: She exhibits tenderness and swelling. She exhibits no bony tenderness, no edema, no deformity and no laceration.     Right lower leg: No  edema.     Left lower leg: No edema.     Comments: Left arm with point tenderness on palpation to lower outer aspect of upper arm.  Full ROM present bilateral arms, without discomfort.  Mild warmth present and slight edema to lower aspect of left upper outer arm.  No lacerations or rashes noted.  Skin:    General: Skin is warm and dry.  Neurological:     Mental Status: She is alert and oriented to person, place, and time.  Psychiatric:        Attention and Perception: Attention normal.        Mood and Affect: Mood normal.        Behavior: Behavior normal. Behavior is cooperative.        Thought Content: Thought content normal.        Judgment: Judgment normal.     Comments: Pleasantly confused.     Results for orders placed or performed in visit on 07/09/19  Bayer DCA Hb A1c Waived  Result Value Ref Range   HB A1C (BAYER DCA - WAIVED) 9.2 (H) <7.0 %  TSH  Result Value Ref Range   TSH 3.770 0.450 - 4.500 uIU/mL      Assessment & Plan:   Problem List Items Addressed This Visit      Other   Neuralgia - Primary    Past history of surgery melanoma to location.  Will increase Gabapentin to 400 MG TID (CRCL 52.24) and provide Tramadol low dose to be used only if severe pain (discussed this with her daughter at length == script sent for 3 tablets only to split in 1/2).  Imaging ordered of area and script for Doxycycline due to warmth and mild edema to site.  Referral to pain clinic to further assist, possible injections to area.  Return in one week for follow-up.      Relevant Orders   Ambulatory referral to Pain Clinic       Follow up plan: Return in about 1 week (around 08/03/2019) for Arm pain with Dr. Lenna Sciara.

## 2019-07-27 NOTE — Patient Instructions (Signed)
[place], [place] [address]  Pain Medicine Instructions You may need pain medicine after an injury or illness. Two common types of pain medicine are:  Opioid pain medicine. These may be called opioids.  Non-opioid pain medicine. This includes NSAIDs. It is important to follow your doctor's instructions when you are taking pain medicine. Doing this can keep yourself and others safe. How can pain medicine affect me? Pain medicine may not make all of your pain go away. It should make you comfortable enough to:  Move.  Breathe.  Do normal activities. Opioids can cause side effects, such as:  Trouble pooping (constipation).  Feeling sick to your stomach (nausea).  Throwing up (vomiting).  Feeling very sleepy.  Confusion.  Taking the medicine for nonmedical reasons even though taking it hurts your health and well-being (opioid use disorder).  Trouble breathing (respiratory depression). Taking opioids for longer than 3 days raises your risk of these side effects. Taking opioids for a long time can affect how well you can do daily tasks. Taking them for a long time also puts you at risk for:  Car crashes.  Depression.  Suicide.  Heart attack.  Taking too much of the medicine (overdose). This can lead to death. What should I do to stay safe while taking pain medicine? Take your medicine as told  Take pain medicine exactly as told by your doctor. Take it only when you need it.  Write down the times when you take your pain medicine. Look at the times before you take your next dose.  Take other over-the-counter or prescription medicines only as told by your doctor. ? If your pain medicine has acetaminophen in it, do not take any other acetaminophen while you are taking this medicine. Too much can damage the liver.  Get pain medicine prescriptions from only one doctor. Avoid certain activities While you are taking prescription pain medicine, and for 8  hours after your last dose:  Do not drive.  Do not use machinery.  Do not use power tools.  Do not sign legal documents.  Do not drink alcohol.  Do not take sleeping pills.  Do not take care of children by yourself.  Do not do any activities that involve climbing or being in high places.  Do not go into any body of water unless there is an adult nearby who can watch you and help you if needed. This includes: ? [place]. ? Rivers. ? Oceans. ? Spas. ? Swimming pools.  Keep others safe  Store your medicine as told by your doctor. Keep it where children and pets cannot reach it.  Do not share your pain medicine with anyone.  Do not save any leftover pills. If you have leftover pills, you can: ? Bring them to a take-back program. ? Bring them to a pharmacy that has a drug disposal container. ? Throw them in the trash. Check the medicine label or package insert to see if it is safe to throw it out. If it is safe, take the medicine out of the container. Mix it with something that makes it unusable, such as pet waste. Then put the medicine in the trash. General instructions  Talk with your doctor about other ways to manage your pain.  If you have trouble pooping: ? Drink enough fluid to keep your pee (urine) pale yellow. ? Use a poop (stool) softener as told by your doctor. ? Eat more fruits and vegetables.  Keep all follow-up visits as  told by your doctor. This is important. Contact a doctor if:  Your medicine is not helping with your pain.  You have a rash.  You feel depressed. Get help right away if: Seek medical care right away if you are taking pain medicines and you (or people close to you) notice any of the following:  Trouble breathing.  Breathing that is shorter than normal.  Breathing that is more shallow than normal.  Confusion.  Sleepiness.  Trouble staying awake.  Feeling sick to your stomach.  Throwing up.  Your skin or lips turning pale or  bluish in color.  Tongue swelling. If you ever feel like you may hurt yourself or others, or have thoughts about taking your own life, get help right away. Go to your nearest emergency department or call:  Your local emergency services (911 in the [place]  A suicide crisis helpline, such as the [place] at [phone]. This is open 24 hours a day. Summary  Take your pain medicine exactly as told by your doctor.  Pain medicine can help lower your pain. It may also cause side effects.  Talk with your doctor about other ways to manage your pain.  Follow your doctor's instructions about how to take your pain medicine and keep others safe. Ask what activities you should avoid while taking pain medicine. This information is not intended to replace advice given to you by your health care provider. Make sure you discuss any questions you have with your health care provider. Document Released: 05/20/2008 Document Revised: 11/14/2017 Document Reviewed: 07/14/2017 Elsevier Patient Education  2020 [employer].

## 2019-07-27 NOTE — Assessment & Plan Note (Addendum)
Past history of surgery melanoma to location.  Will increase Gabapentin to 400 MG TID (CRCL 52.24) and provide Tramadol low dose to be used only if severe pain (discussed this with her daughter at length == script sent for 3 tablets only to split in 1/2).  Imaging ordered of area and script for Doxycycline due to warmth and mild edema to site.  Referral to pain clinic to further assist, possible injections to area.  Return in one week for follow-up.

## 2019-08-10 ENCOUNTER — Ambulatory Visit: Payer: Medicare (Managed Care) | Admitting: Family Medicine

## 2019-08-10 ENCOUNTER — Other Ambulatory Visit: Payer: Self-pay

## 2019-08-10 ENCOUNTER — Ambulatory Visit (INDEPENDENT_AMBULATORY_CARE_PROVIDER_SITE_OTHER): Payer: Medicare Other | Admitting: Family Medicine

## 2019-08-10 ENCOUNTER — Telehealth: Payer: Self-pay | Admitting: Family Medicine

## 2019-08-10 ENCOUNTER — Encounter: Payer: Self-pay | Admitting: Family Medicine

## 2019-08-10 VITALS — BP 136/74 | HR 97 | Temp 99.7°F | Ht 63.0 in | Wt 131.0 lb

## 2019-08-10 DIAGNOSIS — E119 Type 2 diabetes mellitus without complications: Secondary | ICD-10-CM

## 2019-08-10 DIAGNOSIS — M792 Neuralgia and neuritis, unspecified: Secondary | ICD-10-CM | POA: Diagnosis not present

## 2019-08-10 DIAGNOSIS — R410 Disorientation, unspecified: Secondary | ICD-10-CM

## 2019-08-10 MED ORDER — IBUPROFEN 200 MG PO CAPS: 400.0000 mg | ORAL_CAPSULE | ORAL | 6 refills | Status: DC | PRN

## 2019-08-10 MED ORDER — NORTRIPTYLINE HCL 10 MG PO CAPS: 10.0000 mg | ORAL_CAPSULE | Freq: Every day | ORAL | 3 refills | Status: DC

## 2019-08-10 NOTE — Progress Notes (Signed)
BP 136/74   Pulse 97   Temp 99.7 F (37.6 C) (Oral)   Ht 5\' 3"  (1.6 m)   Wt 131 lb (59.4 kg)   SpO2 95%   BMI 23.21 kg/m    Subjective:    Patient ID: Lorraine Vargas, female    DOB: 1951/11/12, 68 y.o.   MRN: [id number]  HPI: Lorraine Vargas is a 68 y.o. female  Chief Complaint  Patient presents with  . Arm Pain    left arm f/u   Has been more confused- was hitting her legs into the wall a couple of days ago. Daughter is concerned.   DIABETES- tolerating the metformin well. Had a bit of diarrhea 1x, but otherwise doing OK Hypoglycemic episodes:no Polydipsia/polyuria: no Visual disturbance: no Chest pain: no Paresthesias: yes Glucose Monitoring: no  Accucheck frequency: Not Checking Taking Insulin?: no Blood Pressure Monitoring: not checking Retinal Examination: Not up to Date Foot Exam: done today Diabetic Education: Not Completed Pneumovax: Up to Date Influenza: not available Aspirin: no  ARM PAIN- has been taking ibuprofen (moderate) 2 pills a couple of times a day, gabapentin (not helping), tramadol (not helping), lidocaine patches (didn't help- stopped using them) Duration: chronic Location: left upper arm Mechanism of injury: melanoma surgery about a year ago Onset: sudden Severity: severe  Quality:  "pain" Frequency: constant Radiation: no Aggravating factors: unknown- seems to be when she's not distracted   Alleviating factors: NSAIDS   Status: stable Treatments attempted: gabapentin, tramadol, rest, ice, heat, APAP, ibuprofen and aleve  Relief with NSAIDs?:  moderate Swelling: no Redness: no  Warmth: no Trauma: no Chest pain: no  Shortness of breath: no  Fever: no Decreased sensation: no Paresthesias: yes Weakness: yes  Relevant past medical, surgical, family and social history reviewed and updated as indicated. Interim medical history since our last visit reviewed. Allergies and medications reviewed and updated.  Review of  Systems  Constitutional: Negative.   Respiratory: Negative.   Cardiovascular: Negative.   Musculoskeletal: Positive for myalgias. Negative for arthralgias, back pain, gait problem, joint swelling, neck pain and neck stiffness.  Skin: Negative.   Neurological: Negative.   Psychiatric/Behavioral: Positive for confusion and decreased concentration. Negative for agitation, behavioral problems, dysphoric mood, hallucinations, self-injury, sleep disturbance and suicidal ideas. The patient is nervous/anxious. The patient is not hyperactive.     Per HPI unless specifically indicated above     Objective:    BP 136/74   Pulse 97   Temp 99.7 F (37.6 C) (Oral)   Ht 5\' 3"  (1.6 m)   Wt 131 lb (59.4 kg)   SpO2 95%   BMI 23.21 kg/m   Wt Readings from Last 3 Encounters:  08/10/19 131 lb (59.4 kg)  07/27/19 131 lb (59.4 kg)  06/17/19 135 lb (61.2 kg)    Physical Exam Vitals signs and nursing note reviewed.  Constitutional:      General: She is not in acute distress.    Appearance: Normal appearance. She is not ill-appearing, toxic-appearing or diaphoretic.  HENT:     Head: Normocephalic and atraumatic.     Right Ear: External ear normal.     Left Ear: External ear normal.     Nose: Nose normal.     Mouth/Throat:     Mouth: Mucous membranes are moist.     Pharynx: Oropharynx is clear.  Eyes:     General: No scleral icterus.       Right eye: No discharge.  Left eye: No discharge.     Extraocular Movements: Extraocular movements intact.     Conjunctiva/sclera: Conjunctivae normal.     Pupils: Pupils are equal, round, and reactive to light.  Neck:     Musculoskeletal: Normal range of motion and neck supple.  Cardiovascular:     Rate and Rhythm: Normal rate and regular rhythm.     Pulses: Normal pulses.     Heart sounds: Normal heart sounds. No murmur. No friction rub. No gallop.   Pulmonary:     Effort: Pulmonary effort is normal. No respiratory distress.     Breath sounds:  Normal breath sounds. No stridor. No wheezing, rhonchi or rales.  Chest:     Chest wall: No tenderness.  Musculoskeletal: Normal range of motion.  Skin:    General: Skin is warm and dry.     Capillary Refill: Capillary refill takes less than 2 seconds.     Coloration: Skin is not jaundiced or pale.     Findings: No bruising, erythema, lesion or rash.  Neurological:     General: No focal deficit present.     Mental Status: She is alert and oriented to person, place, and time. Mental status is at baseline.  Psychiatric:        Mood and Affect: Mood normal.        Behavior: Behavior normal.        Cognition and Memory: Cognition is impaired. Memory is impaired.        Judgment: Judgment is impulsive.     Results for orders placed or performed in visit on 07/09/19  Bayer DCA Hb A1c Waived  Result Value Ref Range   HB A1C (BAYER DCA - WAIVED) 9.2 (H) <7.0 %  TSH  Result Value Ref Range   TSH 3.770 0.450 - 4.500 uIU/mL      Assessment & Plan:   Problem List Items Addressed This Visit      Endocrine   Diabetes mellitus without complication ([place]) - Primary    Tolerating metformin well. Continue to monitor. Recheck 2 months.       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 No improvement with gabapentin, lidocaine, tramadol, moderate improvement with ibuprofen. Continue ibuprofen. Referral to pain management for hopeful interventional methods in the work. Will get her into neurology for evaluation. May need EMG. Will start nortriptyline at bedtime to try to help with pain. Continue to monitor.       Relevant Orders   Ambulatory referral to Neurology    Other Visit Diagnoses    Confusion       Will check urine. Continue to follow with her psychiatrist. Referral to neurology made today.   Relevant Orders   UA/M w/rflx Culture, Routine       Follow up plan: Return OK to cancel next week appointment, follow  up 1 month.

## 2019-08-10 NOTE — Telephone Encounter (Signed)
Please let them know we will monitor closely and it's OK for right now. Thanks

## 2019-08-10 NOTE — Telephone Encounter (Signed)
Pharmacy notified.

## 2019-08-10 NOTE — Assessment & Plan Note (Signed)
Tolerating metformin well. Continue to monitor. Recheck 2 months.

## 2019-08-10 NOTE — Assessment & Plan Note (Signed)
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 No improvement with gabapentin, lidocaine, tramadol, moderate improvement with ibuprofen. Continue ibuprofen. Referral to pain management for hopeful interventional methods in the work. Will get her into neurology for evaluation. May need EMG. Will start nortriptyline at bedtime to try to help with pain. Continue to monitor.

## 2019-08-10 NOTE — Telephone Encounter (Signed)
nortriptyline (PAMELOR) 10 MG capsule [[id number]]  Dextromethorphan-quiNIDine (NUEDEXTA) 20-10 MG capsule   Best number [phone] [facility] , Mickel Baas or Nicki Reaper  They called to let provider know that these 2 drugs are coming back with a interaction  Please advise

## 2019-08-11 ENCOUNTER — Telehealth: Payer: Self-pay | Admitting: Family Medicine

## 2019-08-11 LAB — BASIC METABOLIC PANEL
BUN/Creatinine Ratio: 28 (ref 12–28)
BUN: 32 mg/dL — ABNORMAL HIGH (ref 8–27)
CO2: 22 mmol/L (ref 20–29)
Calcium: 9.7 mg/dL (ref 8.7–10.3)
Chloride: 96 mmol/L (ref 96–106)
Creatinine, Ser: 1.16 mg/dL — ABNORMAL HIGH (ref 0.57–1.00)
GFR calc Af Amer: 56 mL/min/{1.73_m2} — ABNORMAL LOW (ref >59)
GFR calc non Af Amer: 49 mL/min/{1.73_m2} — ABNORMAL LOW (ref >59)
Glucose: 169 mg/dL — ABNORMAL HIGH (ref 65–99)
Potassium: 4.3 mmol/L (ref 3.5–5.2)
Sodium: 138 mmol/L (ref 134–144)

## 2019-08-11 NOTE — Telephone Encounter (Signed)
Please let her daughter know that her kidney function got a little worse. Please make sure she's drinking plenty of water and we'll recheck it next time. Thanks!

## 2019-08-11 NOTE — Telephone Encounter (Signed)
Patient's daughter notified.

## 2019-08-12 LAB — MICROSCOPIC EXAMINATION
Bacteria, UA: NONE SEEN
RBC, Urine: NONE SEEN /hpf (ref 0–2)

## 2019-08-12 LAB — URINE CULTURE, REFLEX

## 2019-08-12 LAB — UA/M W/RFLX CULTURE, ROUTINE
Bilirubin, UA: NEGATIVE
Glucose, UA: NEGATIVE
Ketones, UA: NEGATIVE
Nitrite, UA: NEGATIVE
Protein,UA: NEGATIVE
RBC, UA: NEGATIVE
Specific Gravity, UA: 1.015 (ref 1.005–1.030)
Urobilinogen, Ur: 0.2 mg/dL (ref 0.2–1.0)
pH, UA: 5.5 (ref 5.0–7.5)

## 2019-08-16 ENCOUNTER — Other Ambulatory Visit: Payer: Self-pay

## 2019-08-16 ENCOUNTER — Telehealth: Payer: Self-pay

## 2019-08-16 DIAGNOSIS — Z1211 Encounter for screening for malignant neoplasm of colon: Secondary | ICD-10-CM

## 2019-08-16 DIAGNOSIS — Z86010 Personal history of colonic polyps: Secondary | ICD-10-CM

## 2019-08-16 MED ORDER — NA SULFATE-K SULFATE-MG SULF 17.5-3.13-1.6 GM/177ML PO SOLN: 1.0000 | Freq: Once | ORAL | 0 refills | Status: AC

## 2019-08-16 NOTE — Telephone Encounter (Signed)
Gastroenterology Pre-Procedure Review  Request Date: 08/26/19 Requesting Physician: Dr. Allen Norris  PATIENT REVIEW QUESTIONS: The daughter [name] responded to the following health history questions as indicated:    1. Are you having any GI issues? yes (bowel urgency-in office appt declined by daughter) 2. Do you have a personal history of Polyps? yes (unsure of the year) 3. Do you have a family history of Colon Cancer or Polyps? daughter ulcerative colitis 4. Diabetes Mellitus? yes (recently diagnosed takes metformin) 5. Joint replacements in the past 12 months?no 6. Major health problems in the past 3 months?no 7. Any artificial heart valves, MVP, or defibrillator?no    MEDICATIONS & ALLERGIES:    Patient reports the following regarding taking any anticoagulation/antiplatelet therapy:   Plavix, Coumadin, Eliquis, Xarelto, Lovenox, Pradaxa, Brilinta, or Effient? no Aspirin? yes (81 mg daily)  Patient confirms/reports the following medications:  Current Outpatient Medications  Medication Sig Dispense Refill  . albuterol (PROVENTIL HFA;VENTOLIN HFA) 108 (90 Base) MCG/ACT inhaler Inhale 1-2 puffs into the lungs every 6 (six) hours as needed for wheezing or shortness of breath. Use with spacer 1 Inhaler 0  . ARIPiprazole (ABILIFY) 5 MG tablet Take by mouth.    Marland Kitchen atenolol (TENORMIN) 50 MG tablet Take 1 tablet (50 mg total) by mouth daily. 90 tablet 1  . benzonatate (TESSALON) 100 MG capsule Take by mouth at bedtime as needed for cough.    Marland Kitchen buPROPion (WELLBUTRIN XL) 150 MG 24 hr tablet Take by mouth.    . Dextromethorphan-quiNIDine (NUEDEXTA) 20-10 MG capsule Take 1 capsule by mouth daily. Will increase to bid on 01/22/19    . gabapentin (NEURONTIN) 400 MG capsule Take 1 capsule (400 mg total) by mouth 3 (three) times daily. 120 capsule 1  . hydrochlorothiazide (HYDRODIURIL) 25 MG tablet Take 1 tablet (25 mg total) by mouth daily. 90 tablet 1  . Ibuprofen 200 MG CAPS Take 2 capsules (400 mg total)  by mouth every 4 (four) hours as needed. daily 120 capsule 6  . losartan (COZAAR) 100 MG tablet Take 1 tablet (100 mg total) by mouth daily. 90 tablet 1  . memantine (NAMENDA) 10 MG tablet Take 10 mg by mouth 2 (two) times daily.    . metFORMIN (GLUCOPHAGE-XR) 500 MG 24 hr tablet Take 1 tablet (500 mg total) by mouth 2 (two) times daily. 60 tablet 3  . mirtazapine (REMERON) 45 MG tablet Take 45 mg by mouth at bedtime.    . Na Sulfate-K Sulfate-Mg Sulf 17.5-3.13-1.6 GM/177ML SOLN Take 1 kit by mouth once for 1 dose. 354 mL 0  . nortriptyline (PAMELOR) 10 MG capsule Take 1 capsule (10 mg total) by mouth at bedtime. 30 capsule 3  . rosuvastatin (CRESTOR) 20 MG tablet Take 1 tablet (20 mg total) by mouth daily. 90 tablet 1   No current facility-administered medications for this visit.     Patient confirms/reports the following allergies:  Allergies  Allergen Reactions  . Amlodipine Rash  . Penicillin G     Other reaction(s): Vomiting  . Lisinopril     Other reaction(s): Cough    No orders of the defined types were placed in this encounter.   AUTHORIZATION INFORMATION Primary Insurance: 1D#: Group #:  Secondary Insurance: 1D#: Group #:  SCHEDULE INFORMATION: Date: 08/26/19 Time: Location:[facility]

## 2019-08-18 ENCOUNTER — Other Ambulatory Visit: Payer: Self-pay

## 2019-08-18 ENCOUNTER — Encounter: Payer: Self-pay | Admitting: *Deleted

## 2019-08-19 ENCOUNTER — Ambulatory Visit: Payer: Medicare (Managed Care) | Admitting: Family Medicine

## 2019-08-20 LAB — HM DIABETES EYE EXAM

## 2019-08-24 ENCOUNTER — Other Ambulatory Visit
Admission: RE | Admit: 2019-08-24 | Discharge: 2019-08-24 | Disposition: A | Discharge status: Home / Self Care | Payer: Medicare Other | Source: Ambulatory Visit | Attending: Gastroenterology | Admitting: Gastroenterology

## 2019-08-24 ENCOUNTER — Other Ambulatory Visit: Payer: Self-pay

## 2019-08-24 DIAGNOSIS — Z01812 Encounter for preprocedural laboratory examination: Secondary | ICD-10-CM | POA: Diagnosis not present

## 2019-08-24 DIAGNOSIS — Z20828 Contact with and (suspected) exposure to other viral communicable diseases: Secondary | ICD-10-CM | POA: Insufficient documentation

## 2019-08-25 ENCOUNTER — Other Ambulatory Visit: Payer: Self-pay

## 2019-08-25 ENCOUNTER — Telehealth: Payer: Self-pay

## 2019-08-25 LAB — SARS CORONAVIRUS 2 (TAT 6-24 HRS): SARS Coronavirus 2: NEGATIVE

## 2019-08-25 MED ORDER — GOLYTELY 236 G PO SOLR: 4000.0000 mL | Freq: Once | ORAL | 0 refills | Status: AC

## 2019-08-25 NOTE — Anesthesia Preprocedure Evaluation (Addendum)
Anesthesia Evaluation  Patient identified by MRN, date of birth, ID band Patient awake    Reviewed: Allergy & Precautions, NPO status , Patient's Chart, lab work & pertinent test results  History of Anesthesia Complications Negative for: history of anesthetic complications  Airway Mallampati: I   Neck ROM: Full    Dental  (+)    Pulmonary COPD, former smoker (quit 09/2018),    Pulmonary exam normal breath sounds clear to auscultation       Cardiovascular hypertension, Normal cardiovascular exam Rhythm:Regular Rate:Normal     Neuro/Psych PSYCHIATRIC DISORDERS Bipolar Disorder Schizophrenia Dementia 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; residual left-sided weakness and numbness; 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)    GI/Hepatic GERD  ,  Endo/Other  diabetes, Type 2  Renal/GU negative Renal ROS     Musculoskeletal   Abdominal   Peds  Hematology Melanoma    Anesthesia Other Findings   Reproductive/Obstetrics                            Anesthesia Physical Anesthesia Plan  ASA: III  Anesthesia Plan: General   Post-op Pain Management:    Induction:   PONV Risk Score and Plan: 3 and Propofol infusion and TIVA  Airway Management Planned: Natural Airway  Additional Equipment:   Intra-op Plan:   Post-operative Plan:   Informed Consent: I have reviewed the patients History and Physical, chart, labs and discussed the procedure including the risks, benefits and alternatives for the proposed anesthesia with the patient or authorized representative who has indicated his/her understanding and acceptance.       Plan Discussed with: CRNA  Anesthesia Plan Comments:        Anesthesia Quick Evaluation

## 2019-08-25 NOTE — Telephone Encounter (Signed)
Patients daughter Tammy LVM yesterday stating that Suprep bowel kit was going to cost $125, requested an alternative.  Returned patients daughters call.  Rx has been changed to Golytely.  I instructed patients daughter that Golytely will need to be started the evening before her procedure at 5pm drinking 8 oz every 30 minutes until her mother has completed the entire contents.  Thanks [employer]

## 2019-08-25 NOTE — Discharge Instructions (Signed)

## 2019-08-26 ENCOUNTER — Ambulatory Visit
Admission: RE | Admit: 2019-08-26 | Discharge: 2019-08-26 | Disposition: A | Discharge status: Home / Self Care | Payer: Medicare Other | Attending: Gastroenterology | Admitting: Gastroenterology

## 2019-08-26 ENCOUNTER — Encounter
Admission: RE | Disposition: A | Discharge status: Home / Self Care | Payer: Self-pay | Source: Home / Self Care | Attending: Gastroenterology

## 2019-08-26 ENCOUNTER — Ambulatory Visit: Payer: Medicare Other | Admitting: Anesthesiology

## 2019-08-26 DIAGNOSIS — Z79899 Other long term (current) drug therapy: Secondary | ICD-10-CM | POA: Insufficient documentation

## 2019-08-26 DIAGNOSIS — D123 Benign neoplasm of transverse colon: Secondary | ICD-10-CM | POA: Insufficient documentation

## 2019-08-26 DIAGNOSIS — Z85820 Personal history of malignant melanoma of skin: Secondary | ICD-10-CM | POA: Insufficient documentation

## 2019-08-26 DIAGNOSIS — Z1211 Encounter for screening for malignant neoplasm of colon: Secondary | ICD-10-CM

## 2019-08-26 DIAGNOSIS — F0390 Unspecified dementia without behavioral disturbance: Secondary | ICD-10-CM | POA: Diagnosis not present

## 2019-08-26 DIAGNOSIS — F209 Schizophrenia, unspecified: Secondary | ICD-10-CM | POA: Insufficient documentation

## 2019-08-26 DIAGNOSIS — Z8673 Personal history of transient ischemic attack (TIA), and cerebral infarction without residual deficits: Secondary | ICD-10-CM | POA: Insufficient documentation

## 2019-08-26 DIAGNOSIS — F3181 Bipolar II disorder: Secondary | ICD-10-CM | POA: Insufficient documentation

## 2019-08-26 DIAGNOSIS — Z87891 Personal history of nicotine dependence: Secondary | ICD-10-CM | POA: Diagnosis not present

## 2019-08-26 DIAGNOSIS — E119 Type 2 diabetes mellitus without complications: Secondary | ICD-10-CM | POA: Insufficient documentation

## 2019-08-26 DIAGNOSIS — I10 Essential (primary) hypertension: Secondary | ICD-10-CM | POA: Insufficient documentation

## 2019-08-26 DIAGNOSIS — K635 Polyp of colon: Secondary | ICD-10-CM

## 2019-08-26 DIAGNOSIS — K640 First degree hemorrhoids: Secondary | ICD-10-CM | POA: Insufficient documentation

## 2019-08-26 DIAGNOSIS — J449 Chronic obstructive pulmonary disease, unspecified: Secondary | ICD-10-CM | POA: Insufficient documentation

## 2019-08-26 DIAGNOSIS — Z7984 Long term (current) use of oral hypoglycemic drugs: Secondary | ICD-10-CM | POA: Diagnosis not present

## 2019-08-26 DIAGNOSIS — K219 Gastro-esophageal reflux disease without esophagitis: Secondary | ICD-10-CM | POA: Insufficient documentation

## 2019-08-26 HISTORY — PX: POLYPECTOMY: SHX5525

## 2019-08-26 HISTORY — DX: Motion sickness, initial encounter: T75.3XXA

## 2019-08-26 HISTORY — DX: Other symptoms and signs involving the musculoskeletal system: R29.898

## 2019-08-26 HISTORY — PX: COLONOSCOPY WITH PROPOFOL: SHX5780

## 2019-08-26 HISTORY — DX: Type 2 diabetes mellitus without complications: E11.9

## 2019-08-26 LAB — GLUCOSE, CAPILLARY
Glucose-Capillary: 118 mg/dL — ABNORMAL HIGH (ref 70–99)
Glucose-Capillary: 111 mg/dL — ABNORMAL HIGH (ref 70–99)

## 2019-08-26 SURGERY — COLONOSCOPY WITH PROPOFOL
Anesthesia: General | Site: Rectum

## 2019-08-26 MED ORDER — ACETAMINOPHEN 325 MG PO TABS: 650.0000 mg | ORAL_TABLET | Freq: Once | ORAL | Status: DC | PRN

## 2019-08-26 MED ORDER — ONDANSETRON HCL 4 MG/2ML IJ SOLN: 4.0000 mg | Freq: Once | INTRAMUSCULAR | Status: DC | PRN

## 2019-08-26 MED ORDER — LACTATED RINGERS IV SOLN
10.0000 mL/h | INTRAVENOUS | Status: DC
  Administered 2019-08-26: 07:00:00 10 mL/h via INTRAVENOUS

## 2019-08-26 MED ORDER — PROPOFOL 10 MG/ML IV BOLUS
INTRAVENOUS | Status: DC | PRN
  Administered 2019-08-26: 20 mg via INTRAVENOUS
  Administered 2019-08-26 (×2): 30 mg via INTRAVENOUS
  Administered 2019-08-26: 50 mg via INTRAVENOUS

## 2019-08-26 MED ORDER — EPHEDRINE SULFATE 50 MG/ML IJ SOLN
INTRAMUSCULAR | Status: DC | PRN
  Administered 2019-08-26 (×2): 10 mg via INTRAVENOUS

## 2019-08-26 MED ORDER — LIDOCAINE HCL (CARDIAC) PF 100 MG/5ML IV SOSY
PREFILLED_SYRINGE | INTRAVENOUS | Status: DC | PRN
  Administered 2019-08-26: 30 mg via INTRAVENOUS

## 2019-08-26 MED ORDER — ACETAMINOPHEN 160 MG/5ML PO SOLN: 325.0000 mg | ORAL | Status: DC | PRN

## 2019-08-26 MED ORDER — STERILE WATER FOR IRRIGATION IR SOLN
Status: DC | PRN
  Administered 2019-08-26: .05 mL

## 2019-08-26 SURGICAL SUPPLY — 8 items
CANISTER SUCT 1200ML W/VALVE (MISCELLANEOUS) ×4 IMPLANT
FORCEPS BIOP RAD 4 LRG CAP 4 (CUTTING FORCEPS) IMPLANT
GOWN CVR UNV OPN BCK APRN NK (MISCELLANEOUS) ×4 IMPLANT
GOWN ISOL THUMB LOOP REG UNIV (MISCELLANEOUS) ×4
KIT ENDO PROCEDURE OLY (KITS) ×4 IMPLANT
SNARE SHORT THROW 13M SML OVAL (MISCELLANEOUS) ×4 IMPLANT
TRAP ETRAP POLY (MISCELLANEOUS) ×4 IMPLANT
WATER STERILE IRR 250ML POUR (IV SOLUTION) ×4 IMPLANT

## 2019-08-26 NOTE — Op Note (Signed)
[facility] Gastroenterology Patient Name: Lorraine Vargas Procedure Date: 08/26/2019 7:40 AM MRN: [id number] Account #: [id number] Date of Birth: March 06, 1951 Admit Type: Outpatient Age: 68 Room: [facility] OR ROOM 01 Gender: Female Note Status: Finalized Procedure:            Colonoscopy Indications:          Screening for colorectal malignant neoplasm Providers:            Lucilla Lame MD, MD Referring MD:         Valerie Roys (Referring MD) Medicines:            Propofol per Anesthesia Complications:        No immediate complications. Procedure:            Pre-Anesthesia Assessment:                       - Prior to the procedure, a History and Physical was                        performed, and patient medications and allergies were                        reviewed. The patient's tolerance of previous                        anesthesia was also reviewed. The risks and benefits of                        the procedure and the sedation options and risks were                        discussed with the patient. All questions were                        answered, and informed consent was obtained. Prior                        Anticoagulants: The patient has taken no previous                        anticoagulant or antiplatelet agents. ASA Grade                        Assessment: II - A patient with mild systemic disease.                        After reviewing the risks and benefits, the patient was                        deemed in satisfactory condition to undergo the                        procedure.                       After obtaining informed consent, the colonoscope was                        passed under direct vision. Throughout the procedure,  the patient's blood pressure, pulse, and oxygen                        saturations were monitored continuously. The                        Colonoscope was introduced through the anus and    advanced to the the cecum, identified by appendiceal                        orifice and ileocecal valve. The colonoscopy was                        performed without difficulty. The patient tolerated the                        procedure well. The quality of the bowel preparation                        was excellent. Findings:      The perianal and digital rectal examinations were normal.      A 7 mm polyp was found in the transverse colon. The polyp was sessile.       The polyp was removed with a cold snare. Resection and retrieval were       complete.      Non-bleeding internal hemorrhoids were found during retroflexion. The       hemorrhoids were Grade I (internal hemorrhoids that do not prolapse). Impression:           - One 7 mm polyp in the transverse colon, removed with                        a cold snare. Resected and retrieved.                       - Non-bleeding internal hemorrhoids. Recommendation:       - Discharge patient to home.                       - Resume previous diet.                       - Continue present medications.                       - Await pathology results.                       - Repeat colonoscopy in 5 years if polyp adenoma and 10                        years if hyperplastic Procedure Code(s):    --- Professional ---                       [phone], Colonoscopy, flexible; with removal of tumor(s),                        polyp(s), or other lesion(s) by snare technique Diagnosis Code(s):    --- Professional ---  Z12.11, Encounter for screening for malignant neoplasm                        of colon                       K63.5, Polyp of colon CPT copyright 2019 American Medical Association. All rights reserved. The codes documented in this report are preliminary and upon coder review may  be revised to meet current compliance requirements. Lucilla Lame MD, MD 08/26/2019 8:30:13 AM This report has been signed electronically. Number of  Addenda: 0 Note Initiated On: 08/26/2019 7:40 AM Scope Withdrawal Time: 0 hours 7 minutes 53 seconds  Total Procedure Duration: 0 hours 10 minutes 52 seconds  Estimated Blood Loss: Estimated blood loss: none.      [facility]

## 2019-08-26 NOTE — Transfer of Care (Addendum)
Immediate Anesthesia Transfer of Care Note  Patient: Lorraine Vargas  Procedure(s) Performed: COLONOSCOPY WITH PROPOFOL (N/A Rectum) POLYPECTOMY (Rectum)  Patient Location: PACU  Anesthesia Type: General  Level of Consciousness: awake, alert  and patient cooperative  Airway and Oxygen Therapy: Patient Spontanous Breathing and   Post-op Assessment: Post-op Vital signs reviewed, Patient's Cardiovascular Status Stable, Respiratory Function Stable, Patent Airway and No signs of Nausea or vomiting  Post-op Vital Signs: Reviewed and stable, BP is low, Ephedrine given, fluids infusing. Dr. Erenest Rasher aware.   Complications: No apparent anesthesia complications

## 2019-08-26 NOTE — Anesthesia Postprocedure Evaluation (Signed)
Anesthesia Post Note  Patient: Lorraine Vargas  Procedure(s) Performed: COLONOSCOPY WITH PROPOFOL (N/A Rectum) POLYPECTOMY (Rectum)  Patient location during evaluation: PACU Anesthesia Type: General Level of consciousness: awake and alert, oriented and patient cooperative Pain management: pain level controlled Vital Signs Assessment: post-procedure vital signs reviewed and stable Respiratory status: spontaneous breathing, nonlabored ventilation and respiratory function stable Cardiovascular status: blood pressure returned to baseline and stable Postop Assessment: adequate PO intake Anesthetic complications: no Comments: Mild hypotension in PACU, treated with ephedrine 10mg .    Darrin Nipper

## 2019-08-26 NOTE — Anesthesia Procedure Notes (Signed)
Procedure Name: MAC Date/Time: 08/26/2019 8:18 AM Performed by: Georga Bora, CRNA Pre-anesthesia Checklist: Patient identified, Emergency Drugs available, Suction available, Patient being monitored and Timeout performed Patient Re-evaluated:Patient Re-evaluated prior to induction Oxygen Delivery Method: Nasal cannula

## 2019-08-26 NOTE — H&P (Signed)
Lucilla Lame, MD [address] [place], [place] [address] Phone: [phone] Fax : [phone]  Primary Care Physician:  Valerie Roys, DO Primary Gastroenterologist:  Dr. Allen Norris  Pre-Procedure History & Physical: HPI:  Lorraine Vargas is a 68 y.o. female is here for a screening colonoscopy.   Past Medical History:  Diagnosis Date  . Bipolar 2 disorder ([place])   . COPD (chronic obstructive pulmonary disease) ([place])   . Dementia ([place])   . Diabetes mellitus, type 2 ([place])   . GERD (gastroesophageal reflux disease)   . History of melanoma   . Hypertension   . Left leg weakness    S/P TIA  . Motion sickness    cars  . Schizophrenia ([place])   . 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 ABDOMINAL HYSTERECTOMY    . COLON SURGERY      Prior to Admission medications   Medication Sig Start Date End Date Taking? Authorizing Provider  albuterol (PROVENTIL HFA;VENTOLIN HFA) 108 (90 Base) MCG/ACT inhaler Inhale 1-2 puffs into the lungs every 6 (six) hours as needed for wheezing or shortness of breath. Use with spacer 01/19/19  Yes Lorin Picket, PA-C  ARIPiprazole (ABILIFY) 5 MG tablet Take 10 mg by mouth daily.  09/15/18  Yes [provider]  atenolol (TENORMIN) 50 MG tablet Take 1 tablet (50 mg total) by mouth daily. 05/28/19 05/27/20 Yes Johnson, Megan P, DO  buPROPion (WELLBUTRIN XL) 150 MG 24 hr tablet Take 300 mg by mouth daily.  07/10/18 08/18/19 Yes [provider]  Dextromethorphan-quiNIDine (NUEDEXTA) 20-10 MG capsule Take 1 capsule by mouth daily. Will increase to bid on 01/22/19   Yes [provider]  divalproex (DEPAKOTE) 125 MG DR tablet Take 250 mg by mouth 2 (two) times daily.   Yes [provider]  gabapentin (NEURONTIN) 400 MG capsule Take 1 capsule (400 mg total) by mouth 3 (three) times daily. Patient taking differently: Take 600 mg by mouth 3 (three) times daily.   07/27/19  Yes Cannady, Jolene T, NP  hydrochlorothiazide (HYDRODIURIL) 25 MG tablet Take 1 tablet (25 mg total) by mouth daily. 07/05/19  Yes Johnson, Megan P, DO  Ibuprofen 200 MG CAPS Take 2 capsules (400 mg total) by mouth every 4 (four) hours as needed. daily 08/10/19  Yes Johnson, Megan P, DO  losartan (COZAAR) 100 MG tablet Take 1 tablet (100 mg total) by mouth daily. 07/05/19 07/04/20 Yes Johnson, Megan P, DO  memantine (NAMENDA) 10 MG tablet Take 10 mg by mouth 2 (two) times daily. 07/06/19  Yes [provider]  metFORMIN (GLUCOPHAGE-XR) 500 MG 24 hr tablet Take 1 tablet (500 mg total) by mouth 2 (two) times daily. 07/19/19  Yes Johnson, Megan P, DO  mirtazapine (REMERON) 45 MG tablet Take 45 mg by mouth at bedtime. 07/06/19  Yes [provider]  rosuvastatin (CRESTOR) 20 MG tablet Take 1 tablet (20 mg total) by mouth daily. 05/28/19 05/27/20 Yes Johnson, Megan P, DO  benzonatate (TESSALON) 100 MG capsule Take by mouth at bedtime as needed for cough.    [provider]  nortriptyline (PAMELOR) 10 MG capsule Take 1 capsule (10 mg total) by mouth at bedtime. Patient not taking: Reported on 08/18/2019 08/10/19   Park Liter P, DO    Allergies as of 08/16/2019 - Review Complete 08/10/2019  Allergen Reaction Noted  . Amlodipine Rash 11/02/2012  . Penicillin g  05/28/2018  . Lisinopril  01/19/2019    Family History  Problem Relation Age of Onset  . Dementia Mother   . Congestive Heart Failure Father   . Diabetes Father   . Heart disease Sister     Social History   Socioeconomic History  . Marital status: Married    Spouse name: Not on file  . Number of children: Not on file  . Years of education: Not on file  . Highest education level: Not on file  Occupational History  . Not on file  Social Needs  . Financial resource strain: Not on file  . Food insecurity    Worry: Not on file    Inability: Not on file  . Transportation needs    Medical: Not on file     Non-medical: Not on file  Tobacco Use  . Smoking status: Former Smoker    Quit date: 09/18/2018    Years since quitting: 0.9  . Smokeless tobacco: Never Used  Substance and Sexual Activity  . Alcohol use: Not Currently    Frequency: Never  . Drug use: Never  . Sexual activity: Not on file  Lifestyle  . Physical activity    Days per week: Not on file    Minutes per session: Not on file  . Stress: Not on file  Relationships  . Social Herbalist on phone: Not on file    Gets together: Not on file    Attends religious service: Not on file    Active member of club or organization: Not on file    Attends meetings of clubs or organizations: Not on file    Relationship status: Not on file  . Intimate partner violence    Fear of current or ex partner: Not on file    Emotionally abused: Not on file    Physically abused: Not on file    Forced sexual activity: Not on file  Other Topics Concern  . Not on file  Social History Narrative  . Not on file    Review of Systems: See HPI, otherwise negative ROS  Physical Exam: BP (!) 142/64   Pulse 81   Temp 98.6 F (37 C) (Temporal)   Resp 16   Ht 5\' 3"  (1.6 m)   Wt 59 kg   SpO2 98%   BMI 23.03 kg/m  General:   Alert,  pleasant and cooperative in NAD Head:  Normocephalic and atraumatic. Neck:  Supple; no masses or thyromegaly. Lungs:  Clear throughout to auscultation.    Heart:  Regular rate and rhythm. Abdomen:  Soft, nontender and nondistended. Normal bowel sounds, without guarding, and without rebound.   Neurologic:  Alert and  oriented x4;  grossly normal neurologically.  Impression/Plan: Lorraine Vargas is now here to undergo a screening colonoscopy.  Risks, benefits, and alternatives regarding colonoscopy have been reviewed with the patient.  Questions have been answered.  All parties agreeable.

## 2019-08-27 ENCOUNTER — Encounter: Payer: Self-pay | Admitting: Gastroenterology

## 2019-08-30 ENCOUNTER — Encounter: Payer: Self-pay | Admitting: Gastroenterology

## 2019-08-31 ENCOUNTER — Encounter: Payer: Self-pay | Admitting: Gastroenterology

## 2019-09-01 ENCOUNTER — Ambulatory Visit: Payer: Self-pay

## 2019-09-01 NOTE — Telephone Encounter (Signed)
Patients daughter called stating that her mother is not feeling well.  She has had a colonoscopy 9/10 and sense then has had difficulty sleeping. She has become more forgetful.  Today she is very tired and states that she just doesn't feel right. Her BP 94/56 HR77. Daughter states that after her colonoscopy they had a a hard time getting her BP stable. It was too low. She states that her mother has not been drinking fluids as she normally would. She has taken her BP medication today. Daughter reports that her mother always had high BP. "She has had mini strokes" Per protocol patient will go to ER for evaluation.  Car advice read to daughter.  She verbalized understanding of all information.  Reason for Disposition . [6] Fall in systolic BP > 20 mm Hg from normal AND [2] dizzy, lightheaded, or weak  Answer Assessment - Initial Assessment Questions 1. BLOOD PRESSURE: "What is the blood pressure?" "Did you take at least two measurements 5 minutes apart?"     94/56 hr 77 2. ONSET: "When did you take your blood pressure?"     1530 today 3. HOW: "How did you obtain the blood pressure?" (e.g., visiting nurse, automatic home BP monitor)     home 4. HISTORY: "Do you have a history of low blood pressure?" "What is your blood pressure normally?"     hypertension 5. MEDICATIONS: "Are you taking any medications for blood pressure?" If yes: "Have they been changed recently?"     yes 6. PULSE RATE: "Do you know what your pulse rate is?"     77 7. OTHER SYMPTOMS: "Have you been sick recently?" "Have you had a recent injury?"   Recent sedation Colonoscopy 10 8. PREGNANCY: "Is there any chance you are pregnant?" "When was your last menstrual period?"     N/A  Protocols used: LOW BLOOD PRESSURE-A-AH

## 2019-09-07 ENCOUNTER — Encounter: Payer: Self-pay | Admitting: Family Medicine

## 2019-09-08 ENCOUNTER — Telehealth: Payer: Self-pay

## 2019-09-08 MED ORDER — ATENOLOL 100 MG PO TABS: 50.0000 mg | ORAL_TABLET | Freq: Every day | ORAL | 0 refills | Status: DC

## 2019-09-08 NOTE — Telephone Encounter (Signed)
Pharmacy sent a fax stating that Atenolol 50 mg is on backorder. Can we send in an RX for 25 mg BID or 100 mg, take 0.5 tab?

## 2019-09-16 ENCOUNTER — Other Ambulatory Visit: Payer: Self-pay

## 2019-09-16 ENCOUNTER — Encounter: Payer: Self-pay | Admitting: Family Medicine

## 2019-09-16 ENCOUNTER — Ambulatory Visit: Payer: Medicare (Managed Care) | Admitting: Family Medicine

## 2019-09-16 ENCOUNTER — Ambulatory Visit (INDEPENDENT_AMBULATORY_CARE_PROVIDER_SITE_OTHER): Payer: Medicare Other | Admitting: Family Medicine

## 2019-09-16 VITALS — BP 131/73 | HR 87 | Temp 98.2°F

## 2019-09-16 DIAGNOSIS — Z23 Encounter for immunization: Secondary | ICD-10-CM

## 2019-09-16 DIAGNOSIS — N179 Acute kidney failure, unspecified: Secondary | ICD-10-CM | POA: Diagnosis not present

## 2019-09-16 DIAGNOSIS — F209 Schizophrenia, unspecified: Secondary | ICD-10-CM | POA: Diagnosis not present

## 2019-09-16 DIAGNOSIS — M792 Neuralgia and neuritis, unspecified: Secondary | ICD-10-CM | POA: Diagnosis not present

## 2019-09-16 DIAGNOSIS — D72829 Elevated white blood cell count, unspecified: Secondary | ICD-10-CM

## 2019-09-16 NOTE — Progress Notes (Signed)
BP 131/73   Pulse 87   Temp 98.2 F (36.8 C)   SpO2 93%    Subjective:    Patient ID: Lorraine Vargas, female    DOB: Jan 20, 1951, 68 y.o.   MRN: [id number]  HPI: Lorraine Vargas is a 69 y.o. female  Chief Complaint  Patient presents with  . Hospitalization Follow-up  . Arm Pain   Her blood pressure dropped and she ended up in [facility] and she was hospitalized for a couple of days. She has been doing better since she got out of the hospital- they stopped her HCTZ.  HOSPITAL FOLLOW UP Time since discharge:  1 week Hospital/facility: [facility] Diagnosis: Hyponatremia, leukocytosis Procedures/tests: labs, CXR,  Consultants: none New medications: none Discharge instructions:  Follow up here Status: better  Today, she is in severe pain in her back- crying in the room, depakote doesn't seem to be helping. Did not start the nortriptyline due to neurology's input. Continued on gabapentin. The neurologists think that her pain may be psychologic. Her mood is still not doing well- has had some medication changes, saw them last week and seeing again in a month. She is otherwise doing well with no other concerns or complaints at this time.   Relevant past medical, surgical, family and social history reviewed and updated as indicated. Interim medical history since our last visit reviewed. Allergies and medications reviewed and updated.  Review of Systems  Constitutional: Negative.   Respiratory: Negative.   Cardiovascular: Negative.   Musculoskeletal: Positive for back pain, myalgias, neck pain and neck stiffness. Negative for arthralgias, gait problem and joint swelling.  Skin: Negative.   Neurological: Negative.   Psychiatric/Behavioral: Positive for agitation, behavioral problems, confusion, dysphoric mood and sleep disturbance. Negative for decreased concentration, hallucinations, self-injury and suicidal ideas. The patient is nervous/anxious. The patient is  not hyperactive.     Per HPI unless specifically indicated above     Objective:    BP 131/73   Pulse 87   Temp 98.2 F (36.8 C)   SpO2 93%   Wt Readings from Last 3 Encounters:  08/26/19 130 lb (59 kg)  08/10/19 131 lb (59.4 kg)  07/27/19 131 lb (59.4 kg)    Physical Exam Vitals signs and nursing note reviewed.  Constitutional:      General: She is not in acute distress.    Appearance: Normal appearance. She is not ill-appearing, toxic-appearing or diaphoretic.  HENT:     Head: Normocephalic and atraumatic.     Right Ear: External ear normal.     Left Ear: External ear normal.     Nose: Nose normal.     Mouth/Throat:     Mouth: Mucous membranes are moist.     Pharynx: Oropharynx is clear.  Eyes:     General: No scleral icterus.       Right eye: No discharge.        Left eye: No discharge.     Extraocular Movements: Extraocular movements intact.     Conjunctiva/sclera: Conjunctivae normal.     Pupils: Pupils are equal, round, and reactive to light.  Neck:     Musculoskeletal: Normal range of motion and neck supple.  Cardiovascular:     Rate and Rhythm: Normal rate and regular rhythm.     Pulses: Normal pulses.     Heart sounds: Normal heart sounds. No murmur. No friction rub. No gallop.   Pulmonary:     Effort: Pulmonary effort is normal. No  respiratory distress.     Breath sounds: Normal breath sounds. No stridor. No wheezing, rhonchi or rales.  Chest:     Chest wall: No tenderness.  Musculoskeletal: Normal range of motion.  Skin:    General: Skin is warm and dry.     Capillary Refill: Capillary refill takes less than 2 seconds.     Coloration: Skin is not jaundiced or pale.     Findings: No bruising, erythema, lesion or rash.  Neurological:     General: No focal deficit present.     Mental Status: She is alert and oriented to person, place, and time. Mental status is at baseline.  Psychiatric:        Mood and Affect: Mood is anxious and depressed. Affect is  tearful.        Behavior: Behavior normal.        Thought Content: Thought content normal.        Cognition and Memory: Cognition is impaired.        Judgment: Judgment normal.     Results for orders placed or performed in visit on 09/16/19  CBC with Differential/Platelet  Result Value Ref Range   WBC 17.7 (H) 3.4 - 10.8 x10E3/uL   RBC 3.30 (L) 3.77 - 5.28 x10E6/uL   Hemoglobin 9.3 (L) 11.1 - 15.9 g/dL   Hematocrit 29.3 (L) 34.0 - 46.6 %   MCV 89 79 - 97 fL   MCH 28.2 26.6 - 33.0 pg   MCHC 31.7 31.5 - 35.7 g/dL   RDW 14.2 11.7 - 15.4 %   Platelets 408 150 - 450 x10E3/uL   Neutrophils 83 Not Estab. %   Lymphs 9 Not Estab. %   Monocytes 6 Not Estab. %   Eos 1 Not Estab. %   Basos 0 Not Estab. %   Neutrophils Absolute 14.7 (H) 1.4 - 7.0 x10E3/uL   Lymphocytes Absolute 1.6 0.7 - 3.1 x10E3/uL   Monocytes Absolute 1.1 (H) 0.1 - 0.9 x10E3/uL   EOS (ABSOLUTE) 0.1 0.0 - 0.4 x10E3/uL   Basophils Absolute 0.1 0.0 - 0.2 x10E3/uL   Immature Granulocytes 1 Not Estab. %   Immature Grans (Abs) 0.2 (H) 0.0 - 0.1 [id number]/YH  Basic metabolic panel  Result Value Ref Range   Glucose 238 (H) 65 - 99 mg/dL   BUN 12 8 - 27 mg/dL   Creatinine, Ser 0.97 0.57 - 1.00 mg/dL   GFR calc non Af Amer 61 >59 mL/min/1.73   GFR calc Af Amer 70 >59 mL/min/1.73   BUN/Creatinine Ratio 12 12 - 28   Sodium 134 134 - 144 mmol/L   Potassium 4.4 3.5 - 5.2 mmol/L   Chloride 94 (L) 96 - 106 mmol/L   CO2 20 20 - 29 mmol/L   Calcium 9.2 8.7 - 10.3 mg/dL      Assessment & Plan:   Problem List Items Addressed This Visit      Other   Neuralgia    Getting new appointment for EMG scheduled. Await neurology input.       Schizophrenia ([place]) - Primary    Continues to follow with psychiatry. Call with any concerns. Continue to monitor.        Other Visit Diagnoses    Leukocytosis, unspecified type       Labs drawn today. Await results.    Relevant Orders   CBC with Differential/Platelet (Completed)   AKI  (acute kidney injury) ([place])       Labs drawn today. Await results.  Relevant Orders   Basic metabolic panel (Completed)   Immunization due       Flu shot given today.    Relevant Orders   Flu Vaccine QUAD High Dose(Fluad) (Completed)       Follow up plan: Return in about 4 weeks (around 10/14/2019).

## 2019-09-17 ENCOUNTER — Other Ambulatory Visit: Payer: Medicare Other

## 2019-09-18 LAB — BASIC METABOLIC PANEL
BUN/Creatinine Ratio: 12 (ref 12–28)
BUN: 12 mg/dL (ref 8–27)
CO2: 20 mmol/L (ref 20–29)
Calcium: 9.2 mg/dL (ref 8.7–10.3)
Chloride: 94 mmol/L — ABNORMAL LOW (ref 96–106)
Creatinine, Ser: 0.97 mg/dL (ref 0.57–1.00)
GFR calc Af Amer: 70 mL/min/{1.73_m2} (ref >59)
GFR calc non Af Amer: 61 mL/min/{1.73_m2} (ref >59)
Glucose: 238 mg/dL — ABNORMAL HIGH (ref 65–99)
Potassium: 4.4 mmol/L (ref 3.5–5.2)
Sodium: 134 mmol/L (ref 134–144)

## 2019-09-18 LAB — CBC WITH DIFFERENTIAL/PLATELET
Basophils Absolute: 0.1 10*3/uL (ref 0.0–0.2)
Basos: 0 %
EOS (ABSOLUTE): 0.1 10*3/uL (ref 0.0–0.4)
Eos: 1 %
Hematocrit: 29.3 % — ABNORMAL LOW (ref 34.0–46.6)
Hemoglobin: 9.3 g/dL — ABNORMAL LOW (ref 11.1–15.9)
Immature Grans (Abs): 0.2 10*3/uL — ABNORMAL HIGH (ref 0.0–0.1)
Immature Granulocytes: 1 %
Lymphocytes Absolute: 1.6 10*3/uL (ref 0.7–3.1)
Lymphs: 9 %
MCH: 28.2 pg (ref 26.6–33.0)
MCHC: 31.7 g/dL (ref 31.5–35.7)
MCV: 89 fL (ref 79–97)
Monocytes Absolute: 1.1 10*3/uL — ABNORMAL HIGH (ref 0.1–0.9)
Monocytes: 6 %
Neutrophils Absolute: 14.7 10*3/uL — ABNORMAL HIGH (ref 1.4–7.0)
Neutrophils: 83 %
Platelets: 408 10*3/uL (ref 150–450)
RBC: 3.3 x10E6/uL — ABNORMAL LOW (ref 3.77–5.28)
RDW: 14.2 % (ref 11.7–15.4)
WBC: 17.7 10*3/uL — ABNORMAL HIGH (ref 3.4–10.8)

## 2019-09-19 DIAGNOSIS — F209 Schizophrenia, unspecified: Secondary | ICD-10-CM | POA: Insufficient documentation

## 2019-09-19 NOTE — Assessment & Plan Note (Signed)
Continues to follow with psychiatry. Call with any concerns. Continue to monitor.

## 2019-09-19 NOTE — Assessment & Plan Note (Signed)
Getting new appointment for EMG scheduled. Await neurology input.

## 2019-09-20 ENCOUNTER — Telehealth: Payer: Self-pay | Admitting: Family Medicine

## 2019-09-20 DIAGNOSIS — D72829 Elevated white blood cell count, unspecified: Secondary | ICD-10-CM

## 2019-09-20 DIAGNOSIS — M792 Neuralgia and neuritis, unspecified: Secondary | ICD-10-CM

## 2019-09-20 DIAGNOSIS — R410 Disorientation, unspecified: Secondary | ICD-10-CM

## 2019-09-20 NOTE — Telephone Encounter (Signed)
Please let her daughter know that her kidney function is back to normal, but her white blood cells are still high- I'd like her to come back in in 1 week to recheck it. Order in.

## 2019-09-21 ENCOUNTER — Other Ambulatory Visit: Payer: Medicare Other

## 2019-09-21 NOTE — Telephone Encounter (Signed)
Patient's daughter notified.

## 2019-09-21 NOTE — Telephone Encounter (Signed)
Order in- Home health should be calling her

## 2019-09-21 NOTE — Telephone Encounter (Signed)
Patient's daughter notified and lab appointment scheduled for next week.   Patient's daughter states that her mom was supposed to have PT and OT when she was discharged from the hospital. She states that PT came out but was since told that the company they were using was not covered. Patients daughter wants to know if we can put orders in for PT and OT for the patient.

## 2019-09-28 ENCOUNTER — Other Ambulatory Visit: Payer: Medicare Other

## 2019-09-28 ENCOUNTER — Telehealth: Payer: Self-pay | Admitting: Family Medicine

## 2019-09-28 ENCOUNTER — Other Ambulatory Visit: Payer: Self-pay

## 2019-09-28 DIAGNOSIS — D72829 Elevated white blood cell count, unspecified: Secondary | ICD-10-CM

## 2019-09-28 LAB — CBC WITH DIFFERENTIAL/PLATELET
Hematocrit: 31.8 % — ABNORMAL LOW (ref 34.0–46.6)
Hemoglobin: 10.3 g/dL — ABNORMAL LOW (ref 11.1–15.9)
Lymphocytes Absolute: 2.5 10*3/uL (ref 0.7–3.1)
Lymphs: 15 %
MCH: 28.7 pg (ref 26.6–33.0)
MCHC: 32.4 g/dL (ref 31.5–35.7)
MCV: 89 fL (ref 79–97)
MID (Absolute): 1.4 10*3/uL (ref 0.1–1.6)
MID: 8 %
Neutrophils Absolute: 13.2 10*3/uL — ABNORMAL HIGH (ref 1.4–7.0)
Neutrophils: 77 %
Platelets: 385 10*3/uL (ref 150–450)
RBC: 3.59 x10E6/uL — ABNORMAL LOW (ref 3.77–5.28)
RDW: 15.7 % — ABNORMAL HIGH (ref 11.7–15.4)
WBC: 17.1 10*3/uL — ABNORMAL HIGH (ref 3.4–10.8)

## 2019-09-28 NOTE — Telephone Encounter (Signed)
Patients daughter notified. Lab appointment scheduled for next week.

## 2019-09-28 NOTE — Telephone Encounter (Signed)
Please let her daughter know that her WBC is still a bit high- get that urine in and come back in next week and we'll recheck it. Her anemia is better.

## 2019-09-28 NOTE — Telephone Encounter (Signed)
-----   Message from Georgina Peer, [place] sent at 09/28/2019  3:01 PM EDT ----- In house CBC

## 2019-09-30 ENCOUNTER — Telehealth: Payer: Self-pay

## 2019-09-30 NOTE — Telephone Encounter (Signed)
Called and let Lorraine Vargas know that Dr. Wynetta Emery gave the ok for verbal orders.

## 2019-09-30 NOTE — Telephone Encounter (Signed)
Copied from [place] [id number]. Topic: Quick Communication - Home Health Verbal Orders >> Sep 30, 2019  8:41 AM Ivar Drape wrote: Caller/Agency: Romelle Starcher OT w/[facility] Number:  [phone] Requesting OT/PT/Skilled Nursing/Social Work/Speech Therapy:   OT Frequency: 1w4   Routing to provider for verbal.

## 2019-09-30 NOTE — Telephone Encounter (Signed)
OK to give verbal orders 

## 2019-10-05 ENCOUNTER — Other Ambulatory Visit: Payer: Self-pay

## 2019-10-05 ENCOUNTER — Other Ambulatory Visit: Payer: Self-pay | Admitting: Family Medicine

## 2019-10-05 ENCOUNTER — Other Ambulatory Visit: Payer: Medicare Other

## 2019-10-05 DIAGNOSIS — R300 Dysuria: Secondary | ICD-10-CM

## 2019-10-05 DIAGNOSIS — D72829 Elevated white blood cell count, unspecified: Secondary | ICD-10-CM

## 2019-10-08 ENCOUNTER — Telehealth: Payer: Self-pay | Admitting: Family Medicine

## 2019-10-08 ENCOUNTER — Other Ambulatory Visit: Payer: Self-pay

## 2019-10-08 ENCOUNTER — Ambulatory Visit
Admission: RE | Admit: 2019-10-08 | Discharge: 2019-10-08 | Disposition: A | Discharge status: Home / Self Care | Payer: Medicare Other | Source: Ambulatory Visit | Attending: Family Medicine | Admitting: Family Medicine

## 2019-10-08 ENCOUNTER — Encounter: Payer: Self-pay | Admitting: Intensive Care

## 2019-10-08 ENCOUNTER — Inpatient Hospital Stay
Admission: EM | Admit: 2019-10-08 | Discharge: 2019-10-14 | DRG: 180 | Disposition: A | Discharge status: Hospice | Payer: Medicare Other | Attending: Internal Medicine | Admitting: Internal Medicine

## 2019-10-08 ENCOUNTER — Ambulatory Visit (INDEPENDENT_AMBULATORY_CARE_PROVIDER_SITE_OTHER): Payer: Medicare Other | Admitting: Family Medicine

## 2019-10-08 ENCOUNTER — Encounter: Payer: Self-pay | Admitting: Family Medicine

## 2019-10-08 ENCOUNTER — Ambulatory Visit: Payer: Medicare Other | Admitting: Family Medicine

## 2019-10-08 VITALS — BP 129/72 | HR 82 | Temp 98.3°F | Ht 63.0 in | Wt 124.5 lb

## 2019-10-08 DIAGNOSIS — C781 Secondary malignant neoplasm of mediastinum: Secondary | ICD-10-CM | POA: Diagnosis present

## 2019-10-08 DIAGNOSIS — R0602 Shortness of breath: Secondary | ICD-10-CM

## 2019-10-08 DIAGNOSIS — Z66 Do not resuscitate: Secondary | ICD-10-CM | POA: Diagnosis present

## 2019-10-08 DIAGNOSIS — R634 Abnormal weight loss: Secondary | ICD-10-CM | POA: Insufficient documentation

## 2019-10-08 DIAGNOSIS — R05 Cough: Secondary | ICD-10-CM | POA: Insufficient documentation

## 2019-10-08 DIAGNOSIS — J984 Other disorders of lung: Secondary | ICD-10-CM

## 2019-10-08 DIAGNOSIS — F419 Anxiety disorder, unspecified: Secondary | ICD-10-CM | POA: Diagnosis present

## 2019-10-08 DIAGNOSIS — Z87891 Personal history of nicotine dependence: Secondary | ICD-10-CM

## 2019-10-08 DIAGNOSIS — R059 Cough, unspecified: Secondary | ICD-10-CM

## 2019-10-08 DIAGNOSIS — F0390 Unspecified dementia without behavioral disturbance: Secondary | ICD-10-CM | POA: Diagnosis present

## 2019-10-08 DIAGNOSIS — Z79899 Other long term (current) drug therapy: Secondary | ICD-10-CM

## 2019-10-08 DIAGNOSIS — E039 Hypothyroidism, unspecified: Secondary | ICD-10-CM | POA: Diagnosis present

## 2019-10-08 DIAGNOSIS — M792 Neuralgia and neuritis, unspecified: Secondary | ICD-10-CM

## 2019-10-08 DIAGNOSIS — Z515 Encounter for palliative care: Secondary | ICD-10-CM | POA: Diagnosis present

## 2019-10-08 DIAGNOSIS — E119 Type 2 diabetes mellitus without complications: Secondary | ICD-10-CM | POA: Diagnosis present

## 2019-10-08 DIAGNOSIS — G936 Cerebral edema: Secondary | ICD-10-CM | POA: Diagnosis present

## 2019-10-08 DIAGNOSIS — E785 Hyperlipidemia, unspecified: Secondary | ICD-10-CM | POA: Diagnosis present

## 2019-10-08 DIAGNOSIS — R630 Anorexia: Secondary | ICD-10-CM

## 2019-10-08 DIAGNOSIS — J432 Centrilobular emphysema: Secondary | ICD-10-CM | POA: Diagnosis present

## 2019-10-08 DIAGNOSIS — R918 Other nonspecific abnormal finding of lung field: Secondary | ICD-10-CM | POA: Diagnosis not present

## 2019-10-08 DIAGNOSIS — B961 Klebsiella pneumoniae [K. pneumoniae] as the cause of diseases classified elsewhere: Secondary | ICD-10-CM | POA: Diagnosis present

## 2019-10-08 DIAGNOSIS — F3181 Bipolar II disorder: Secondary | ICD-10-CM

## 2019-10-08 DIAGNOSIS — Z85820 Personal history of malignant melanoma of skin: Secondary | ICD-10-CM

## 2019-10-08 DIAGNOSIS — N3000 Acute cystitis without hematuria: Secondary | ICD-10-CM

## 2019-10-08 DIAGNOSIS — D72829 Elevated white blood cell count, unspecified: Secondary | ICD-10-CM

## 2019-10-08 DIAGNOSIS — C3492 Malignant neoplasm of unspecified part of left bronchus or lung: Secondary | ICD-10-CM | POA: Diagnosis present

## 2019-10-08 DIAGNOSIS — Z20828 Contact with and (suspected) exposure to other viral communicable diseases: Secondary | ICD-10-CM | POA: Diagnosis present

## 2019-10-08 DIAGNOSIS — F209 Schizophrenia, unspecified: Secondary | ICD-10-CM

## 2019-10-08 DIAGNOSIS — R042 Hemoptysis: Secondary | ICD-10-CM | POA: Diagnosis present

## 2019-10-08 DIAGNOSIS — Z8673 Personal history of transient ischemic attack (TIA), and cerebral infarction without residual deficits: Secondary | ICD-10-CM

## 2019-10-08 DIAGNOSIS — Z880 Allergy status to penicillin: Secondary | ICD-10-CM

## 2019-10-08 DIAGNOSIS — Z888 Allergy status to other drugs, medicaments and biological substances status: Secondary | ICD-10-CM

## 2019-10-08 DIAGNOSIS — Z833 Family history of diabetes mellitus: Secondary | ICD-10-CM

## 2019-10-08 DIAGNOSIS — I10 Essential (primary) hypertension: Secondary | ICD-10-CM | POA: Diagnosis present

## 2019-10-08 DIAGNOSIS — F329 Major depressive disorder, single episode, unspecified: Secondary | ICD-10-CM | POA: Diagnosis present

## 2019-10-08 DIAGNOSIS — J9859 Other diseases of mediastinum, not elsewhere classified: Secondary | ICD-10-CM

## 2019-10-08 DIAGNOSIS — Z8249 Family history of ischemic heart disease and other diseases of the circulatory system: Secondary | ICD-10-CM

## 2019-10-08 DIAGNOSIS — C3412 Malignant neoplasm of upper lobe, left bronchus or lung: Secondary | ICD-10-CM | POA: Diagnosis not present

## 2019-10-08 LAB — COMPREHENSIVE METABOLIC PANEL
ALT: 15 U/L (ref 0–44)
AST: 19 U/L (ref 15–41)
Albumin: 3.4 g/dL — ABNORMAL LOW (ref 3.5–5.0)
Alkaline Phosphatase: 94 U/L (ref 38–126)
Anion gap: 12 (ref 5–15)
BUN: 10 mg/dL (ref 8–23)
CO2: 26 mmol/L (ref 22–32)
Calcium: 9.4 mg/dL (ref 8.9–10.3)
Chloride: 95 mmol/L — ABNORMAL LOW (ref 98–111)
Creatinine, Ser: 0.79 mg/dL (ref 0.44–1.00)
GFR calc Af Amer: >60 mL/min (ref >60)
GFR calc non Af Amer: >60 mL/min (ref >60)
Glucose, Bld: 122 mg/dL — ABNORMAL HIGH (ref 70–99)
Potassium: 4.1 mmol/L (ref 3.5–5.1)
Sodium: 133 mmol/L — ABNORMAL LOW (ref 135–145)
Total Bilirubin: 0.4 mg/dL (ref 0.3–1.2)
Total Protein: 8.4 g/dL — ABNORMAL HIGH (ref 6.5–8.1)

## 2019-10-08 LAB — CBC WITH DIFFERENTIAL/PLATELET
Abs Immature Granulocytes: 0.13 10*3/uL — ABNORMAL HIGH (ref 0.00–0.07)
Basophils Absolute: 0.1 10*3/uL (ref 0.0–0.1)
Basophils Relative: 0 %
Eosinophils Absolute: 0.1 10*3/uL (ref 0.0–0.5)
Eosinophils Relative: 0 %
HCT: 35.5 % — ABNORMAL LOW (ref 36.0–46.0)
Hematocrit: 28.9 % — ABNORMAL LOW (ref 34.0–46.6)
Hemoglobin: 9.6 g/dL — ABNORMAL LOW (ref 11.1–15.9)
Hemoglobin: 10.9 g/dL — ABNORMAL LOW (ref 12.0–15.0)
Immature Granulocytes: 1 %
Lymphocytes Absolute: 2.3 10*3/uL (ref 0.7–3.1)
Lymphocytes Relative: 9 %
Lymphs Abs: 1.9 10*3/uL (ref 0.7–4.0)
Lymphs: 10 %
MCH: 29.3 pg (ref 26.6–33.0)
MCH: 27.3 pg (ref 26.0–34.0)
MCHC: 33.2 g/dL (ref 31.5–35.7)
MCHC: 30.7 g/dL (ref 30.0–36.0)
MCV: 88 fL (ref 79–97)
MCV: 89 fL (ref 80.0–100.0)
MID (Absolute): 1.5 10*3/uL (ref 0.1–1.6)
MID: 7 %
Monocytes Absolute: 1.2 10*3/uL — ABNORMAL HIGH (ref 0.1–1.0)
Monocytes Relative: 6 %
Neutro Abs: 18.2 10*3/uL — ABNORMAL HIGH (ref 1.7–7.7)
Neutrophils Absolute: 18.3 10*3/uL — ABNORMAL HIGH (ref 1.4–7.0)
Neutrophils Relative %: 84 %
Neutrophils: 83 %
Platelets: 396 10*3/uL (ref 150–450)
Platelets: 363 10*3/uL (ref 150–400)
RBC: 3.28 x10E6/uL — ABNORMAL LOW (ref 3.77–5.28)
RBC: 3.99 MIL/uL (ref 3.87–5.11)
RDW: 16.5 % — ABNORMAL HIGH (ref 11.7–15.4)
RDW: 16.2 % — ABNORMAL HIGH (ref 11.5–15.5)
WBC: 22.1 10*3/uL (ref 3.4–10.8)
WBC: 21.5 10*3/uL — ABNORMAL HIGH (ref 4.0–10.5)
nRBC: 0 % (ref 0.0–0.2)

## 2019-10-08 LAB — UA/M W/RFLX CULTURE, ROUTINE
Bilirubin, UA: NEGATIVE
Glucose, UA: NEGATIVE
Nitrite, UA: NEGATIVE
Protein,UA: NEGATIVE
RBC, UA: NEGATIVE
Specific Gravity, UA: 1.015 (ref 1.005–1.030)
Urobilinogen, Ur: 0.2 mg/dL (ref 0.2–1.0)
pH, UA: 5.5 (ref 5.0–7.5)

## 2019-10-08 LAB — URINE CULTURE, REFLEX

## 2019-10-08 LAB — MICROSCOPIC EXAMINATION: RBC, Urine: NONE SEEN /hpf (ref 0–2)

## 2019-10-08 LAB — LACTIC ACID, PLASMA: Lactic Acid, Venous: 1.3 mmol/L (ref 0.5–1.9)

## 2019-10-08 LAB — TROPONIN I (HIGH SENSITIVITY): Troponin I (High Sensitivity): 4 ng/L (ref <18)

## 2019-10-08 LAB — SARS CORONAVIRUS 2 BY RT PCR (HOSPITAL ORDER, PERFORMED IN ~~LOC~~ HOSPITAL LAB): SARS Coronavirus 2: NEGATIVE

## 2019-10-08 LAB — PROTIME-INR
INR: 1 (ref 0.8–1.2)
Prothrombin Time: 13.2 seconds (ref 11.4–15.2)

## 2019-10-08 MED ORDER — ONDANSETRON HCL 4 MG/2ML IJ SOLN
4.0000 mg | Freq: Once | INTRAMUSCULAR | Status: AC
  Administered 2019-10-08: 4 mg via INTRAVENOUS
  Filled 2019-10-08: qty 2

## 2019-10-08 MED ORDER — FENTANYL CITRATE (PF) 100 MCG/2ML IJ SOLN
50.0000 ug | INTRAMUSCULAR | Status: AC | PRN
  Administered 2019-10-08 – 2019-10-09 (×2): 50 ug via INTRAVENOUS
  Filled 2019-10-08 (×2): qty 2

## 2019-10-08 MED ORDER — SULFAMETHOXAZOLE-TRIMETHOPRIM 800-160 MG PO TABS: 1.0000 | ORAL_TABLET | Freq: Two times a day (BID) | ORAL | 0 refills | Status: DC

## 2019-10-08 MED ORDER — FENTANYL CITRATE (PF) 100 MCG/2ML IJ SOLN
25.0000 ug | Freq: Once | INTRAMUSCULAR | Status: AC
  Administered 2019-10-08: 25 ug via INTRAVENOUS
  Filled 2019-10-08: qty 2

## 2019-10-08 NOTE — Progress Notes (Signed)
BP 129/72   Pulse 82   Temp 98.3 F (36.8 C) (Oral)   Ht 5\' 3"  (1.6 m)   Wt 124 lb 8 oz (56.5 kg)   SpO2 93%   BMI 22.05 kg/m    Subjective:    Patient ID: Lorraine Vargas, female    DOB: Sep 08, 1951, 68 y.o.   MRN: [id number]  HPI: Lorraine Vargas is a 68 y.o. female  Chief Complaint  Patient presents with  . Anorexia    x over a month  . Fatigue    pt's daugther states that patient complains of feet hurts and unstady while walk  . Cough    productive   Lorraine Vargas comes in today with her daughter for evaluation. She is concerned about her. She hasn't wanted to eat and she will take a couple of bites and then will say that she's full. She has lost 6 lbs in the last couple of months without trying. She has had a bit of chest pain in the center of her chest and has been having some coughing and wheezing. She has been coughing up white phlegm, but it has been tinged with blood as well. She continues with the pain in her arm and her back. She's very upset as she feels like she is not getting enough help. She has been seeing psychiatry- they have been adjusting her medicine and she has been starting feeling a little better.   WEIGHT LOSS Duration: about a month Amount of weight loss: 6lbs Fevers: no Decreased appetite: yes Night sweats: yes Dysphagia/odynophagia: no Chest pain: yes Shortness of breath: yes Cough: yes Nausea: yes Vomiting: no Abdominal pain: no Blood in stool: no Easy bruising/bleeding: no Jaundice: no Polydipsia/polyuria: no Depression: yes Previous colonoscopy: yes   Relevant past medical, surgical, family and social history reviewed and updated as indicated. Interim medical history since our last visit reviewed. Allergies and medications reviewed and updated.  Review of Systems  Constitutional: Positive for fatigue and unexpected weight change. Negative for activity change, appetite change, chills, diaphoresis and fever.  HENT: Negative.    Respiratory: Positive for cough and shortness of breath. Negative for apnea, choking, chest tightness, wheezing and stridor.   Cardiovascular: Positive for chest pain. Negative for palpitations and leg swelling.  Gastrointestinal: Negative.   Musculoskeletal: Negative.   Skin: Negative.   Neurological: Negative.   Psychiatric/Behavioral: Positive for dysphoric mood. Negative for agitation, behavioral problems, confusion, decreased concentration, hallucinations, self-injury, sleep disturbance and suicidal ideas. The patient is nervous/anxious. The patient is not hyperactive.     Per HPI unless specifically indicated above     Objective:    BP 129/72   Pulse 82   Temp 98.3 F (36.8 C) (Oral)   Ht 5\' 3"  (1.6 m)   Wt 124 lb 8 oz (56.5 kg)   SpO2 93%   BMI 22.05 kg/m   Wt Readings from Last 3 Encounters:  10/08/19 124 lb 8 oz (56.5 kg)  08/26/19 130 lb (59 kg)  08/10/19 131 lb (59.4 kg)    Physical Exam Vitals signs and nursing note reviewed.  Constitutional:      General: She is not in acute distress.    Appearance: Normal appearance. She is ill-appearing. She is not toxic-appearing or diaphoretic.  HENT:     Head: Normocephalic and atraumatic.     Right Ear: External ear normal.     Left Ear: External ear normal.     Nose: Nose normal.  Mouth/Throat:     Mouth: Mucous membranes are moist.     Pharynx: Oropharynx is clear.  Eyes:     General: No scleral icterus.       Right eye: No discharge.        Left eye: No discharge.     Extraocular Movements: Extraocular movements intact.     Conjunctiva/sclera: Conjunctivae normal.     Pupils: Pupils are equal, round, and reactive to light.  Neck:     Musculoskeletal: Normal range of motion and neck supple.  Cardiovascular:     Rate and Rhythm: Normal rate and regular rhythm.     Pulses: Normal pulses.     Heart sounds: Normal heart sounds. No murmur. No friction rub. No gallop.   Pulmonary:     Effort: Pulmonary effort  is normal. No respiratory distress.     Breath sounds: Normal breath sounds. No stridor. No wheezing, rhonchi or rales.  Chest:     Chest wall: No tenderness.  Musculoskeletal: Normal range of motion.  Skin:    General: Skin is warm and dry.     Capillary Refill: Capillary refill takes less than 2 seconds.     Coloration: Skin is not jaundiced or pale.     Findings: No bruising, erythema, lesion or rash.  Neurological:     General: No focal deficit present.     Mental Status: She is alert and oriented to person, place, and time. Mental status is at baseline.  Psychiatric:        Mood and Affect: Mood normal.        Behavior: Behavior normal.        Thought Content: Thought content normal.        Judgment: Judgment normal.     Results for orders placed or performed in visit on 10/08/19  CBC With Differential/Platelet  Result Value Ref Range   WBC 22.1 (HH) 3.4 - 10.8 x10E3/uL   RBC 3.28 (L) 3.77 - 5.28 x10E6/uL   Hemoglobin 9.6 (L) 11.1 - 15.9 g/dL   Hematocrit 28.9 (L) 34.0 - 46.6 %   MCV 88 79 - 97 fL   MCH 29.3 26.6 - 33.0 pg   MCHC 33.2 31.5 - 35.7 g/dL   RDW 16.5 (H) 11.7 - 15.4 %   Platelets 396 150 - 450 x10E3/uL   Neutrophils 83 Not Estab. %   Lymphs 10 Not Estab. %   MID 7 Not Estab. %   Neutrophils Absolute 18.3 (H) 1.4 - 7.0 x10E3/uL   Lymphocytes Absolute 2.3 0.7 - 3.1 x10E3/uL   MID (Absolute) 1.5 0.1 - 1.6 X10E3/uL      Assessment & Plan:   Problem List Items Addressed This Visit      Endocrine   Diabetes mellitus without complication ([place])    Has not been doing well. Has multiple issues including her mental illnesses. Not comfortable. Will get referral to palliative to see if we can keep her as comfortable as possible. Referral generated today.       Relevant Orders   Amb Referral to Palliative Care     Nervous and Auditory   Dementia [facility])    Has not been doing well. Has multiple issues including her mental illnesses. Not comfortable. Will get  referral to palliative to see if we can keep her as comfortable as possible. Referral generated today.       Relevant Orders   Amb Referral to Palliative Care     Other  Bipolar 2 disorder ([place])    Has not been doing well. Has multiple issues including her mental illnesses. Not comfortable. Will get referral to palliative to see if we can keep her as comfortable as possible. Referral generated today.       Relevant Orders   Amb Referral to Palliative Care   Neuralgia    Has not been doing well. Has multiple issues including her mental illnesses. Not comfortable. Will get referral to palliative to see if we can keep her as comfortable as possible. Referral generated today.       Relevant Orders   Amb Referral to Palliative Care   Schizophrenia [facility])    Has not been doing well. Has multiple issues including her mental illnesses. Not comfortable. Will get referral to palliative to see if we can keep her as comfortable as possible. Referral generated today.       Relevant Orders   Amb Referral to Palliative Care    Other Visit Diagnoses    Anorexia    -  Primary   Has been losing weight and has been coughing, SOB and having some hemoptysis. She is a former smoker. Concern for mass- will check STAT CT- await results.    Relevant Orders   Comprehensive metabolic panel   CBC With Differential/Platelet (Completed)   CT Chest Wo Contrast   Amb Referral to Palliative Care   Cough       Has been losing weight and has been coughing, SOB and having some hemoptysis. She is a former smoker. Concern for mass- will check STAT CT- await results.    Relevant Orders   CT Chest Wo Contrast   Amb Referral to Palliative Care   Shortness of breath       Has been losing weight and has been coughing, SOB and having some hemoptysis. She is a former smoker. Concern for mass- will check STAT CT- await results.    Relevant Orders   CT Chest Wo Contrast   Amb Referral to Palliative Care   Weight loss        Has been losing weight and has been coughing, SOB and having some hemoptysis. She is a former smoker. Concern for mass- will check STAT CT- await results.    Relevant Orders   CT Chest Wo Contrast   Amb Referral to Palliative Care   Acute cystitis without hematuria       Will treat with bactrim. Call with any concerns. Continue to monitor.        Follow up plan: Return Pending results.Marland Kitchen

## 2019-10-08 NOTE — Telephone Encounter (Signed)
Lorraine Vargas calling from [place] registration calling to see if the procedure has been authorized with her insurance. CB- [phone]

## 2019-10-08 NOTE — Assessment & Plan Note (Signed)
Has not been doing well. Has multiple issues including her mental illnesses. Not comfortable. Will get referral to palliative to see if we can keep her as comfortable as possible. Referral generated today.

## 2019-10-08 NOTE — ED Provider Notes (Signed)
[facility] Emergency Department Provider Note  ____________________________________________   First MD Initiated Contact with Patient 10/08/19 2110     (approximate)  I have reviewed the triage vital signs and the nursing notes.   HISTORY  Chief Complaint Back Pain, Cough, and Shortness of Breath    HPI Lorraine Vargas is a 68 y.o. female with dementia, COPD, schizophrenia who presents with back pain, cough, shortness of breath since March 2020.  Patient had a CT chest done today.  The CT chest showed a 5.6 cm mass that extends into the left posterior superior mediastinum it encases the left subclavian artery and displaces the esophagus and also causes erosion of the portion of the left anterior T1 was concerning for malignancy.  Patient has been having intermittent cough that has sometimes bloody tinged sputum, nothing makes it better, nothing makes it worse.  She also endorses some neck pain and back pain as well.  No urinary incontinence, no leg weakness, no numbness in her bottom or legs.    Past Medical History:  Diagnosis Date  . Bipolar 2 disorder ([place])   . COPD (chronic obstructive pulmonary disease) ([place])   . Dementia ([place])   . Diabetes mellitus, type 2 ([place])   . GERD (gastroesophageal reflux disease)   . History of melanoma   . Hypertension   . Left leg weakness    S/P TIA  . Motion sickness    cars  . Schizophrenia ([place])   . 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 Schizophrenia ([place]) 09/19/2019  . Special screening for malignant neoplasms, colon   . Polyp of transverse colon   . Diabetes mellitus without complication ([place]) 24/23/5361  . Neuralgia 07/05/2019  . Hyperlipemia 05/28/2019  . COPD (chronic obstructive pulmonary disease) ([place])   . GERD (gastroesophageal reflux disease)   . Hypertension   . Dementia ([place])   . Bipolar 2 disorder ([place])   . History of melanoma      Past Surgical History:  Procedure Laterality Date  . ABDOMINAL HYSTERECTOMY    . COLON SURGERY    . COLONOSCOPY WITH PROPOFOL N/A 08/26/2019   Procedure: COLONOSCOPY WITH PROPOFOL;  Surgeon: Lucilla Lame, MD;  Location: [facility];  Service: Endoscopy;  Laterality: N/A;  Diabetic - oral meds  . POLYPECTOMY  08/26/2019   Procedure: POLYPECTOMY;  Surgeon: Lucilla Lame, MD;  Location: [facility];  Service: Endoscopy;;    Prior to Admission medications   Medication Sig Start Date End Date Taking? Authorizing Provider  albuterol (PROVENTIL HFA;VENTOLIN HFA) 108 (90 Base) MCG/ACT inhaler Inhale 1-2 puffs into the lungs every 6 (six) hours as needed for wheezing or shortness of breath. Use with spacer Patient not taking: Reported on 10/08/2019 01/19/19   Crecencio Mc P, PA-C  ARIPiprazole (ABILIFY) 5 MG tablet Take 10 mg by mouth daily.  09/15/18   [provider]  atenolol (TENORMIN) 100 MG tablet Take 0.5 tablets (50 mg total) by mouth daily. 09/08/19 09/07/20  Johnson, Megan P, DO  buPROPion (WELLBUTRIN XL) 300 MG 24 hr tablet TAKE 1 TABLET BY MOUTH ONCE DAILY IN THE MORNING 09/07/19   [provider]  Dextromethorphan-quiNIDine (NUEDEXTA) 20-10 MG capsule Take 1 capsule by mouth daily. Will increase to bid on 01/22/19    [provider]  divalproex (DEPAKOTE) 125 MG DR tablet Take 250 mg by mouth 2 (two) times daily.  [provider]  donepezil (ARICEPT) 5 MG tablet Take 5 mg by mouth at bedtime. 08/31/19   [provider]  gabapentin (NEURONTIN) 400 MG capsule Take 1 capsule (400 mg total) by mouth 3 (three) times daily. Patient taking differently: Take 600 mg by mouth 3 (three) times daily.  07/27/19   Cannady, Henrine Screws T, NP  Ibuprofen 200 MG CAPS Take 2 capsules (400 mg total) by mouth every 4 (four) hours as needed. daily 08/10/19   Park Liter P, DO  levothyroxine (SYNTHROID) 25 MCG tablet Take by mouth.    [provider]  losartan (COZAAR) 100 MG tablet Take 1 tablet (100 mg total) by mouth daily. 07/05/19 07/04/20  Park Liter P, DO  memantine (NAMENDA) 10 MG tablet Take 10 mg by mouth 2 (two) times daily. 07/06/19   [provider]  metFORMIN (GLUCOPHAGE-XR) 500 MG 24 hr tablet Take 1 tablet (500 mg total) by mouth 2 (two) times daily. 07/19/19   Johnson, Megan P, DO  mirtazapine (REMERON) 15 MG tablet TAKE 2 TABLETS BY MOUTH AT BEDTIME FOR 1 WEEK THEN 1 TABLET AT BEDTIME FOR 1 WEEK THEN DISCONTINUE 08/31/19   [provider]  rosuvastatin (CRESTOR) 20 MG tablet Take 1 tablet (20 mg total) by mouth daily. 05/28/19 05/27/20  Park Liter P, DO  sulfamethoxazole-trimethoprim (BACTRIM DS) 800-160 MG tablet Take 1 tablet by mouth 2 (two) times daily. 10/08/19   Johnson, Megan P, DO  traZODone (DESYREL) 50 MG tablet TAKE 1 TABLET BY MOUTH ONCE DAILY AT BEDTIME. FOR SLEEP MAY INCREASE TO 4 TABLETS IF NEEDED 08/31/19   [provider]    Allergies Amlodipine, Penicillin g, and Lisinopril  Family History  Problem Relation Age of Onset  . Dementia Mother   . Congestive Heart Failure Father   . Diabetes Father   . Heart disease Sister     Social History Social History   Tobacco Use  . Smoking status: Former Smoker    Quit date: 09/18/2018    Years since quitting: 1.0  . Smokeless tobacco: Never Used  Substance Use Topics  . Alcohol use: Not Currently    Frequency: Never  . Drug use: Never      Review of Systems Constitutional: No fever/chills, positive weight loss Eyes: No visual changes. ENT: No sore throat. Cardiovascular: Denies chest pain. Respiratory: Positive cough Gastrointestinal: No abdominal pain.  No nausea, no vomiting.  No diarrhea.  No constipation. Genitourinary: Negative for dysuria. Musculoskeletal: Positive for back pain Skin: Negative for rash. Neurological: Negative for headaches, focal weakness or numbness. All other ROS negative  ____________________________________________   PHYSICAL EXAM:  VITAL SIGNS: ED Triage Vitals  Enc Vitals Group     BP 10/08/19 1743 (!) 166/58     Pulse Rate 10/08/19 1743 87     Resp 10/08/19 1743 18     Temp 10/08/19 1743 97.8 F (36.6 C)     Temp Source 10/08/19 1743 Oral     SpO2 10/08/19 1743 100 %     Weight 10/08/19 1744 124 lb (56.2 kg)     Height 10/08/19 1744 5\' 2"  (1.575 m)     Head Circumference --      Peak Flow --      Pain Score 10/08/19 1744 10     Pain Loc --      Pain Edu? --      Excl. in [place]? --     Constitutional: Alert and confused with her baseline dementia  Eyes: Conjunctivae are normal. EOMI. Head: Atraumatic. Nose: No congestion/rhinnorhea. Mouth/Throat: Mucous membranes are moist.   Neck: No stridor. Trachea Midline. FROM Cardiovascular: Normal rate, regular rhythm. Grossly normal heart sounds.  Good peripheral circulation. Respiratory: Normal respiratory effort.  No retractions. Lungs CTAB. Gastrointestinal: Soft and nontender. No distention. No abdominal bruits.  Musculoskeletal: No lower extremity tenderness nor edema.  No joint effusions. Neurologic: Moving all extremities well.  5 out of 5 strength in her legs.  No numbness or tingling.  No obvious cranial nerve deficits. Skin:  Skin is warm, dry and intact. No rash noted. Psychiatric: Mood and affect are normal. Speech and behavior are normal although patient is confused at baseline. GU: Deferred   ____________________________________________   LABS (all labs ordered are listed, but only abnormal results are displayed)  Labs Reviewed  COMPREHENSIVE METABOLIC PANEL - Abnormal; Notable for the following components:      Result Value   Sodium 133 (*)    Chloride 95 (*)    Glucose, Bld 122 (*)    Total Protein 8.4 (*)    Albumin 3.4 (*)    All other components within normal limits  CBC WITH DIFFERENTIAL/PLATELET - Abnormal; Notable for the following components:   WBC 21.5 (*)     Hemoglobin 10.9 (*)    HCT 35.5 (*)    RDW 16.2 (*)    Neutro Abs 18.2 (*)    Monocytes Absolute 1.2 (*)    Abs Immature Granulocytes 0.13 (*)    All other components within normal limits  LACTIC ACID, PLASMA  PROTIME-INR  LACTIC ACID, PLASMA  URINALYSIS, COMPLETE (UACMP) WITH MICROSCOPIC  TROPONIN I (HIGH SENSITIVITY)  TROPONIN I (HIGH SENSITIVITY)   ____________________________________________   ED ECG REPORT I, Vanessa [place], the attending physician, personally viewed and interpreted this ECG.  EKG is normal sinus rate of 85, no ST elevation, no T wave inversion, normal intervals ____________________________________________  RADIOLOGY   Official radiology report(s): Ct Chest Wo Contrast  Result Date: 10/08/2019 CLINICAL DATA:  Cough, shortness of breath and thoracic back pain for 2-3 days. No injuries. EXAM: CT CHEST WITHOUT CONTRAST TECHNIQUE: Multidetector CT imaging of the chest was performed following the standard protocol without IV contrast. COMPARISON:  Chest radiograph, 01/19/2019 FINDINGS: Cardiovascular: Heart is normal in size and configuration. No pericardial effusion. Three-vessel coronary artery calcifications. Aorta normal in caliber. There are aortic atherosclerotic calcifications. Mediastinum/Nodes: Superior mediastinal mass, which may be a left upper lobe mass invading the mediastinum. Mass displaces the upper esophagus to the right. It extends from the prevertebral soft tissues to the anterior left apex and thoracic inlet. Mass measures 4.9 x 3.2 x 4.7 cm. Along its oblique axis, has a longest dimension of 5.6 cm. The mass encases the left subclavian artery and contacts the top of the aortic arch. The mass leads to erosion of the left anterior aspect of the T1 vertebra. Mass extends to the left posterior neck base, infiltrating the neck base fat. Prominent prevascular lymph node adjacent to the AP window measures 8 mm short axis. No enlarged mediastinal lymph nodes.  Esophagus is mildly distended but otherwise unremarkable. Trachea is unremarkable. Normal visualized thyroid gland. Lungs/Pleura: The above described mass may arise from the left upper lobe and invade the mediastinum. It involves the anteromedial aspect of the left upper lobe. There are adjacent linear and reticular opacities consistent with scarring or atelectasis. No other evidence of a lung mass. No nodule. Mild apical pleuroparenchymal scarring. Upper lobe centrilobular and  paraseptal emphysema. No evidence of pneumonia or pulmonary edema. No pleural effusion or pneumothorax. Upper Abdomen: No acute findings. No visualized liver or adrenal masses. Musculoskeletal: No fracture.  No primary bone lesion. IMPRESSION: 1. 5.6 cm mass that extends from the left posterosuperior mediastinum into the medial left upper lobe, and to the left neck base. The mass encases the left subclavian artery, displaces the esophagus to the right and causes erosion a portion of the left anterior T1 vertebra. 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 Tissue sampling is indicated. Recommend consultation with cardiothoracic and or pulmonary medicine. 2. Single prominent anterior mediastinal lymph node near the AP window may be metastatic or reactive. 3. No other evidence of metastatic disease. 4. No acute findings. Specifically, no evidence of pneumonia or pulmonary edema. 5. Mild emphysema. Aortic atherosclerosis and coronary artery calcifications. Aortic Atherosclerosis (ICD10-I70.0) and Emphysema (ICD10-J43.9). Electronically Signed   By: Lajean Manes M.D.   On: 10/08/2019 17:03    ____________________________________________   PROCEDURES  Procedure(s) performed (including Critical Care):  Procedures   ____________________________________________   INITIAL IMPRESSION / ASSESSMENT AND PLAN / ED COURSE  Lorraine Vargas was evaluated in Emergency Department on 10/08/2019 for the symptoms described in the history of present illness. She was evaluated in the context of the global COVID-19 pandemic, which necessitated consideration that the patient might be at risk for infection with the SARS-CoV-2 virus that causes COVID-19. Institutional protocols and algorithms that pertain to the evaluation of patients at risk for COVID-19 are in a state of rapid change based on information released by regulatory bodies including the CDC and federal and state organizations. These policies and algorithms were followed during the patient's care in the ED.    Patient is a 69 year old who presents with a large lung mass that is pushed up against her esophagus in impression onto her back.  Patient has no evidence of cord compression at this time however given her back pain I would recommend MRI.  Also recommend MRI brain to rule out metastatic disease.  No abdominal tenderness suggest abdominal infection.  Patient white count was noted to be elevated but is otherwise has no evidence of bacteremia given her other sirs criteria are negative.  I think this is most likely reactive from the lung cancer.    I had extensive conversation with patient's daughter about further work-up and management of this.  At this time she is unsure if she would like additional MRIs or if they would just like to focus on palliative and pain control.  She is having a hard time taking care of her at home and so would feel more comfortable bringing the patient to hospital for pain control and discussing with palliative care.  Labs notable for a white count of 21.5.  Otherwise patient has no other sepsis criteria.  Lactate is normal.  No notes of ACS given negative troponin.   Discussed the hospital team with admission.  ____________________________________________   FINAL CLINICAL IMPRESSION(S) / ED DIAGNOSES   Final diagnoses:  Lung mass       MEDICATIONS GIVEN DURING THIS VISIT:  Medications  fentaNYL (SUBLIMAZE) injection 50 mcg (50 mcg Intravenous Given 10/08/19 1815)  ondansetron (ZOFRAN) injection 4 mg (4 mg Intravenous Given 10/08/19 1814)     ED Discharge Orders    None       Note:  This document was prepared using Dragon voice recognition  software and may include unintentional dictation errors.   Vanessa [place], MD 10/08/19 [phone]

## 2019-10-08 NOTE — Telephone Encounter (Signed)
Called by radiology for mass in her lung. WBC came back in the office at 22.1. Significant uncontrolled pain. Called [facility] and [facility] and let them know. Will have her go to ER for evaluation. Report called to ER now.

## 2019-10-08 NOTE — ED Triage Notes (Signed)
Patient has had mid back pain, cough, and sob since around March 2020. Daughter at patients side. HX dementia. Patient had CT chest done today and has not had results gone over with her yet. Patient was sent to ER for pain control

## 2019-10-08 NOTE — ED Notes (Signed)
.. ED TO INPATIENT HANDOFF REPORT  ED Nurse Name and Phone #: Deneise Lever [phone]  S Name/Age/Gender Lorraine Vargas 68 y.o. female Room/Bed: ED04A/ED04A  Code Status   Code Status: Not on file  Home/SNF/Other Home Patient oriented to: self and place Is this baseline? Yes   Triage Complete: Triage complete  Chief Complaint Cloudy urine  Triage Note Patient has had mid back pain, cough, and sob since around March 2020. Daughter at patients side. HX dementia. Patient had CT chest done today and has not had results gone over with her yet. Patient was sent to ER for pain control   Allergies Allergies  Allergen Reactions  . Amlodipine Rash  . Penicillin G Nausea And Vomiting       . Lisinopril Cough         Level of Care/Admitting Diagnosis ED Disposition    ED Disposition Condition Comment   Admit  The patient appears reasonably stabilized for admission considering the current resources, flow, and capabilities available in the ED at this time, and I doubt any other [facility] requiring further screening and/or treatment in the ED prior to admission is  present.       B Medical/Surgery History Past Medical History:  Diagnosis Date  . Bipolar 2 disorder ([place])   . COPD (chronic obstructive pulmonary disease) ([place])   . Dementia ([place])   . Diabetes mellitus, type 2 ([place])   . GERD (gastroesophageal reflux disease)   . History of melanoma   . Hypertension   . Left leg weakness    S/P TIA  . Motion sickness    cars  . Schizophrenia ([place])   . 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 ABDOMINAL HYSTERECTOMY    . COLON SURGERY    . COLONOSCOPY WITH PROPOFOL N/A 08/26/2019   Procedure: COLONOSCOPY WITH PROPOFOL;  Surgeon: Lucilla Lame, MD;  Location: [place];  Service: Endoscopy;  Laterality: N/A;  Diabetic - oral meds  . POLYPECTOMY  08/26/2019   Procedure: POLYPECTOMY;  Surgeon: Lucilla Lame, MD;  Location:  [place];  Service: Endoscopy;;     A IV Location/Drains/Wounds Patient Lines/Drains/Airways Status   Active Line/Drains/Airways    Name:   Placement date:   Placement time:   Site:   Days:   Peripheral IV 10/08/19 Right Antecubital   10/08/19    1813    Antecubital   less than 1   Incision (Closed) 08/26/19 Rectum   08/26/19    0826     43          Intake/Output Last 24 hours No intake or output data in the 24 hours ending 10/08/19 2223  Labs/Imaging Results for orders placed or performed during the hospital encounter of 10/08/19 (from the past 48 hour(s))  Comprehensive metabolic panel     Status: Abnormal   Collection Time: 10/08/19  5:49 PM  Result Value Ref Range   Sodium 133 (L) 135 - 145 mmol/L   Potassium 4.1 3.5 - 5.1 mmol/L   Chloride 95 (L) 98 - 111 mmol/L   CO2 26 22 - 32 mmol/L   Glucose, Bld 122 (H) 70 - 99 mg/dL   BUN 10 8 - 23 mg/dL   Creatinine, Ser 0.79 0.44 - 1.00 mg/dL   Calcium 9.4 8.9 - 10.3 mg/dL   Total Protein 8.4 (H) 6.5 - 8.1 g/dL   Albumin 3.4 (L) 3.5 - 5.0 g/dL  AST 19 15 - 41 U/L   ALT 15 0 - 44 U/L   Alkaline Phosphatase 94 38 - 126 U/L   Total Bilirubin 0.4 0.3 - 1.2 mg/dL   GFR calc non Af Amer >60 >60 mL/min   GFR calc Af Amer >60 >60 mL/min   Anion gap 12 5 - 15    Comment: Performed at [facility], [place], [place] [address]  Troponin I (High Sensitivity)     Status: None   Collection Time: 10/08/19  5:49 PM  Result Value Ref Range   Troponin I (High Sensitivity) 4 <18 ng/L    Comment: (NOTE) Elevated high sensitivity troponin I (hsTnI) values and significant  changes across serial measurements may suggest ACS but many other  chronic and acute conditions are known to elevate hsTnI results.  Refer to the "Links" section for chest pain algorithms and additional  guidance. Performed at [facility], [address] [place], [place] [address]   Lactic acid, plasma     Status: None    Collection Time: 10/08/19  5:49 PM  Result Value Ref Range   Lactic Acid, Venous 1.3 0.5 - 1.9 mmol/L    Comment: Performed at [facility], [address] [place], [place] [address]  CBC with Differential     Status: Abnormal   Collection Time: 10/08/19  5:49 PM  Result Value Ref Range   WBC 21.5 (H) 4.0 - 10.5 K/uL   RBC 3.99 3.87 - 5.11 MIL/uL   Hemoglobin 10.9 (L) 12.0 - 15.0 g/dL   HCT 35.5 (L) 36.0 - 46.0 %   MCV 89.0 80.0 - 100.0 fL   MCH 27.3 26.0 - 34.0 pg   MCHC 30.7 30.0 - 36.0 g/dL   RDW 16.2 (H) 11.5 - 15.5 %   Platelets 363 150 - 400 K/uL   nRBC 0.0 0.0 - 0.2 %   Neutrophils Relative % 84 %   Neutro Abs 18.2 (H) 1.7 - 7.7 K/uL   Lymphocytes Relative 9 %   Lymphs Abs 1.9 0.7 - 4.0 K/uL   Monocytes Relative 6 %   Monocytes Absolute 1.2 (H) 0.1 - 1.0 K/uL   Eosinophils Relative 0 %   Eosinophils Absolute 0.1 0.0 - 0.5 K/uL   Basophils Relative 0 %   Basophils Absolute 0.1 0.0 - 0.1 K/uL   Immature Granulocytes 1 %   Abs Immature Granulocytes 0.13 (H) 0.00 - 0.07 K/uL    Comment: Performed at [facility], [address] [place], [place] [address]  Protime-INR     Status: None   Collection Time: 10/08/19  5:49 PM  Result Value Ref Range   Prothrombin Time 13.2 11.4 - 15.2 seconds   INR 1.0 0.8 - 1.2    Comment: (NOTE) INR goal varies based on device and disease states. Performed at [facility], [address] [place],  [address]    Ct Chest Wo Contrast  Result Date: 10/08/2019 CLINICAL DATA:  Cough, shortness of breath and thoracic back pain for 2-3 days. No injuries. EXAM: CT CHEST WITHOUT CONTRAST TECHNIQUE: Multidetector CT imaging of the chest was performed following the standard protocol without IV contrast. COMPARISON:  Chest radiograph, 01/19/2019 FINDINGS: Cardiovascular: Heart is normal in size and configuration. No pericardial effusion. Three-vessel coronary artery calcifications. Aorta normal in caliber. There  are aortic atherosclerotic calcifications. Mediastinum/Nodes: Superior mediastinal mass, which may be a left upper lobe mass invading the mediastinum. Mass displaces the upper  esophagus to the right. It extends from the prevertebral soft tissues to the anterior left apex and thoracic inlet. Mass measures 4.9 x 3.2 x 4.7 cm. Along its oblique axis, has a longest dimension of 5.6 cm. The mass encases the left subclavian artery and contacts the top of the aortic arch. The mass leads to erosion of the left anterior aspect of the T1 vertebra. Mass extends to the left posterior neck base, infiltrating the neck base fat. Prominent prevascular lymph node adjacent to the AP window measures 8 mm short axis. No enlarged mediastinal lymph nodes. Esophagus is mildly distended but otherwise unremarkable. Trachea is unremarkable. Normal visualized thyroid gland. Lungs/Pleura: The above described mass may arise from the left upper lobe and invade the mediastinum. It involves the anteromedial aspect of the left upper lobe. There are adjacent linear and reticular opacities consistent with scarring or atelectasis. No other evidence of a lung mass. No nodule. Mild apical pleuroparenchymal scarring. Upper lobe centrilobular and paraseptal emphysema. No evidence of pneumonia or pulmonary edema. No pleural effusion or pneumothorax. Upper Abdomen: No acute findings. No visualized liver or adrenal masses. Musculoskeletal: No fracture.  No primary bone lesion. IMPRESSION: 1. 5.6 cm mass that extends from the left posterosuperior mediastinum into the medial left upper lobe, and to the left neck base. The mass encases the left subclavian artery, displaces the esophagus to the right and causes erosion a portion of the left anterior T1 vertebra. 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 Tissue sampling is indicated. Recommend consultation  with cardiothoracic and or pulmonary medicine. 2. Single prominent anterior mediastinal lymph node near the AP window may be metastatic or reactive. 3. No other evidence of metastatic disease. 4. No acute findings. Specifically, no evidence of pneumonia or pulmonary edema. 5. Mild emphysema. Aortic atherosclerosis and coronary artery calcifications. Aortic Atherosclerosis (ICD10-I70.0) and Emphysema (ICD10-J43.9). Electronically Signed   By: Lajean Manes M.D.   On: 10/08/2019 17:03    Pending Labs Unresulted Labs (From admission, onward)    Start     Ordered   10/08/19 2134  SARS Coronavirus 2 by RT PCR (hospital order, performed in [facility] hospital lab) Nasopharyngeal Nasopharyngeal Swab  (Symptomatic/High Risk of Exposure/Tier 1 Patients Labs with Precautions)  Once,   STAT    Question Answer Comment  Is this test for diagnosis or screening Screening   Symptomatic for COVID-19 as defined by CDC No   Hospitalized for COVID-19 No   Admitted to ICU for COVID-19 No   Previously tested for COVID-19 Yes   Resident in a congregate (group) care setting No   Employed in healthcare setting No   Pregnant No      10/08/19 2133   10/08/19 1740  Urinalysis, Complete w Microscopic  ONCE - STAT,   STAT     10/08/19 1739   10/08/19 1739  Lactic acid, plasma  Now then every 2 hours,   STAT     10/08/19 1739          Vitals/Pain Today's Vitals   10/08/19 1743 10/08/19 1744 10/08/19 2204 10/08/19 2221  BP: (!) 166/58   133/84  Pulse: 87   76  Resp: 18   14  Temp: 97.8 F (36.6 C)   97.8 F (36.6 C)  TempSrc: Oral   Oral  SpO2: 100%   100%  Weight:  56.2 kg    Height:  5\' 2"  (1.575 m)  PainSc:  10-Worst pain ever 5      Isolation Precautions No active isolations  Medications Medications  fentaNYL (SUBLIMAZE) injection 50 mcg (50 mcg Intravenous Given 10/08/19 1815)  ondansetron (ZOFRAN) injection 4 mg (4 mg Intravenous Given 10/08/19 1814)  fentaNYL (SUBLIMAZE) injection 25 mcg  (25 mcg Intravenous Given 10/08/19 2204)  ondansetron (ZOFRAN) injection 4 mg (4 mg Intravenous Given 10/08/19 2204)    Mobility non-ambulatory High fall risk   Focused Assessments Pulmonary Assessment Handoff:  Lung sounds:   O2 Device: Room Air        R Recommendations: See Admitting Provider Note  Report given to:   Additional Notes:

## 2019-10-09 ENCOUNTER — Inpatient Hospital Stay: Payer: Medicare Other

## 2019-10-09 DIAGNOSIS — E039 Hypothyroidism, unspecified: Secondary | ICD-10-CM | POA: Diagnosis present

## 2019-10-09 DIAGNOSIS — C3412 Malignant neoplasm of upper lobe, left bronchus or lung: Secondary | ICD-10-CM | POA: Diagnosis present

## 2019-10-09 DIAGNOSIS — Z20828 Contact with and (suspected) exposure to other viral communicable diseases: Secondary | ICD-10-CM | POA: Diagnosis present

## 2019-10-09 DIAGNOSIS — Z888 Allergy status to other drugs, medicaments and biological substances status: Secondary | ICD-10-CM | POA: Diagnosis not present

## 2019-10-09 DIAGNOSIS — Z8249 Family history of ischemic heart disease and other diseases of the circulatory system: Secondary | ICD-10-CM | POA: Diagnosis not present

## 2019-10-09 DIAGNOSIS — F419 Anxiety disorder, unspecified: Secondary | ICD-10-CM | POA: Diagnosis present

## 2019-10-09 DIAGNOSIS — Z66 Do not resuscitate: Secondary | ICD-10-CM | POA: Diagnosis present

## 2019-10-09 DIAGNOSIS — I10 Essential (primary) hypertension: Secondary | ICD-10-CM | POA: Diagnosis present

## 2019-10-09 DIAGNOSIS — E785 Hyperlipidemia, unspecified: Secondary | ICD-10-CM | POA: Diagnosis present

## 2019-10-09 DIAGNOSIS — J432 Centrilobular emphysema: Secondary | ICD-10-CM | POA: Diagnosis present

## 2019-10-09 DIAGNOSIS — C781 Secondary malignant neoplasm of mediastinum: Secondary | ICD-10-CM | POA: Diagnosis present

## 2019-10-09 DIAGNOSIS — Z515 Encounter for palliative care: Secondary | ICD-10-CM | POA: Diagnosis present

## 2019-10-09 DIAGNOSIS — F329 Major depressive disorder, single episode, unspecified: Secondary | ICD-10-CM | POA: Diagnosis present

## 2019-10-09 DIAGNOSIS — Z8673 Personal history of transient ischemic attack (TIA), and cerebral infarction without residual deficits: Secondary | ICD-10-CM | POA: Diagnosis not present

## 2019-10-09 DIAGNOSIS — C3492 Malignant neoplasm of unspecified part of left bronchus or lung: Secondary | ICD-10-CM | POA: Diagnosis not present

## 2019-10-09 DIAGNOSIS — R042 Hemoptysis: Secondary | ICD-10-CM | POA: Diagnosis present

## 2019-10-09 DIAGNOSIS — F0390 Unspecified dementia without behavioral disturbance: Secondary | ICD-10-CM | POA: Diagnosis present

## 2019-10-09 DIAGNOSIS — R918 Other nonspecific abnormal finding of lung field: Secondary | ICD-10-CM | POA: Diagnosis present

## 2019-10-09 DIAGNOSIS — N3000 Acute cystitis without hematuria: Secondary | ICD-10-CM | POA: Diagnosis present

## 2019-10-09 DIAGNOSIS — J984 Other disorders of lung: Secondary | ICD-10-CM

## 2019-10-09 DIAGNOSIS — Z87891 Personal history of nicotine dependence: Secondary | ICD-10-CM | POA: Diagnosis not present

## 2019-10-09 DIAGNOSIS — G936 Cerebral edema: Secondary | ICD-10-CM | POA: Diagnosis present

## 2019-10-09 DIAGNOSIS — Z880 Allergy status to penicillin: Secondary | ICD-10-CM | POA: Diagnosis not present

## 2019-10-09 DIAGNOSIS — Z85820 Personal history of malignant melanoma of skin: Secondary | ICD-10-CM | POA: Diagnosis not present

## 2019-10-09 DIAGNOSIS — Z7189 Other specified counseling: Secondary | ICD-10-CM | POA: Diagnosis not present

## 2019-10-09 DIAGNOSIS — E119 Type 2 diabetes mellitus without complications: Secondary | ICD-10-CM | POA: Diagnosis present

## 2019-10-09 DIAGNOSIS — B961 Klebsiella pneumoniae [K. pneumoniae] as the cause of diseases classified elsewhere: Secondary | ICD-10-CM | POA: Diagnosis present

## 2019-10-09 DIAGNOSIS — Z79899 Other long term (current) drug therapy: Secondary | ICD-10-CM | POA: Diagnosis not present

## 2019-10-09 LAB — COMPREHENSIVE METABOLIC PANEL
ALT: 11 IU/L (ref 0–32)
AST: 16 IU/L (ref 0–40)
Albumin/Globulin Ratio: 0.9 — ABNORMAL LOW (ref 1.2–2.2)
Albumin: 3.4 g/dL — ABNORMAL LOW (ref 3.8–4.8)
Alkaline Phosphatase: 109 IU/L (ref 39–117)
BUN/Creatinine Ratio: 10 — ABNORMAL LOW (ref 12–28)
BUN: 9 mg/dL (ref 8–27)
Bilirubin Total: 0.2 mg/dL (ref 0.0–1.2)
CO2: 23 mmol/L (ref 20–29)
Calcium: 9.3 mg/dL (ref 8.7–10.3)
Chloride: 97 mmol/L (ref 96–106)
Creatinine, Ser: 0.9 mg/dL (ref 0.57–1.00)
GFR calc Af Amer: 77 mL/min/{1.73_m2} (ref >59)
GFR calc non Af Amer: 66 mL/min/{1.73_m2} (ref >59)
Globulin, Total: 3.6 g/dL (ref 1.5–4.5)
Glucose: 102 mg/dL — ABNORMAL HIGH (ref 65–99)
Potassium: 4.2 mmol/L (ref 3.5–5.2)
Sodium: 136 mmol/L (ref 134–144)
Total Protein: 7 g/dL (ref 6.0–8.5)

## 2019-10-09 LAB — BLOOD GAS, ARTERIAL
Acid-Base Excess: 4 mmol/L — ABNORMAL HIGH (ref 0.0–2.0)
Bicarbonate: 29.2 mmol/L — ABNORMAL HIGH (ref 20.0–28.0)
FIO2: 0.28
O2 Saturation: 99.2 %
Patient temperature: 37
pCO2 arterial: 45 mmHg (ref 32.0–48.0)
pH, Arterial: 7.42 (ref 7.350–7.450)
pO2, Arterial: 143 mmHg — ABNORMAL HIGH (ref 83.0–108.0)

## 2019-10-09 LAB — LACTIC ACID, PLASMA: Lactic Acid, Venous: 0.9 mmol/L (ref 0.5–1.9)

## 2019-10-09 MED ORDER — MORPHINE 100MG IN NS 100ML (1MG/ML) PREMIX INFUSION: 5.0000 mg/h | INTRAVENOUS | Status: DC

## 2019-10-09 MED ORDER — ATENOLOL 50 MG PO TABS
50.0000 mg | ORAL_TABLET | Freq: Every day | ORAL | Status: DC
  Administered 2019-10-09: 50 mg via ORAL
  Filled 2019-10-09: qty 1

## 2019-10-09 MED ORDER — ENOXAPARIN SODIUM 40 MG/0.4ML ~~LOC~~ SOLN: 40.0000 mg | SUBCUTANEOUS | Status: DC

## 2019-10-09 MED ORDER — SODIUM CHLORIDE 0.9 % IV SOLN
INTRAVENOUS | Status: DC
  Administered 2019-10-09 (×2): via INTRAVENOUS

## 2019-10-09 MED ORDER — MEMANTINE HCL 5 MG PO TABS
10.0000 mg | ORAL_TABLET | Freq: Two times a day (BID) | ORAL | Status: DC
  Administered 2019-10-09: 08:00:00 10 mg via ORAL
  Filled 2019-10-09: qty 2

## 2019-10-09 MED ORDER — BIOTENE DRY MOUTH MT LIQD: 15.0000 mL | OROMUCOSAL | Status: DC | PRN

## 2019-10-09 MED ORDER — NALOXONE HCL 0.4 MG/ML IJ SOLN
0.2000 mg | Freq: Once | INTRAMUSCULAR | Status: AC
  Administered 2019-10-09: 0.2 mg via INTRAVENOUS

## 2019-10-09 MED ORDER — NALOXONE HCL 0.4 MG/ML IJ SOLN
INTRAMUSCULAR | Status: AC
  Filled 2019-10-09: qty 1

## 2019-10-09 MED ORDER — DONEPEZIL HCL 5 MG PO TABS: 5.0000 mg | ORAL_TABLET | Freq: Every day | ORAL | Status: DC

## 2019-10-09 MED ORDER — ACETAMINOPHEN 325 MG PO TABS
650.0000 mg | ORAL_TABLET | Freq: Four times a day (QID) | ORAL | Status: DC | PRN
  Administered 2019-10-11: 650 mg via ORAL
  Filled 2019-10-09: qty 2

## 2019-10-09 MED ORDER — GABAPENTIN 300 MG PO CAPS
600.0000 mg | ORAL_CAPSULE | Freq: Three times a day (TID) | ORAL | Status: DC
  Administered 2019-10-09: 600 mg via ORAL
  Filled 2019-10-09 (×2): qty 2

## 2019-10-09 MED ORDER — SODIUM CHLORIDE 0.9 % IV SOLN
1.0000 g | INTRAVENOUS | Status: DC
  Administered 2019-10-09: 1 g via INTRAVENOUS
  Filled 2019-10-09: qty 10
  Filled 2019-10-09: qty 1

## 2019-10-09 MED ORDER — ARIPIPRAZOLE 10 MG PO TABS
10.0000 mg | ORAL_TABLET | Freq: Every day | ORAL | Status: DC
  Administered 2019-10-09: 08:00:00 10 mg via ORAL
  Filled 2019-10-09: qty 1

## 2019-10-09 MED ORDER — ROSUVASTATIN CALCIUM 20 MG PO TABS
20.0000 mg | ORAL_TABLET | Freq: Every day | ORAL | Status: DC
  Administered 2019-10-09: 20 mg via ORAL
  Filled 2019-10-09: qty 1

## 2019-10-09 MED ORDER — DEXTROMETHORPHAN-QUINIDINE 20-10 MG PO CAPS
1.0000 | ORAL_CAPSULE | Freq: Every day | ORAL | Status: DC
  Filled 2019-10-09: qty 1

## 2019-10-09 MED ORDER — GLYCOPYRROLATE 0.2 MG/ML IJ SOLN
0.2000 mg | INTRAMUSCULAR | Status: DC | PRN
  Administered 2019-10-10 – 2019-10-13 (×3): 0.2 mg via INTRAVENOUS
  Filled 2019-10-09 (×3): qty 1

## 2019-10-09 MED ORDER — MIRTAZAPINE 15 MG PO TABS: 30.0000 mg | ORAL_TABLET | Freq: Every day | ORAL | Status: DC

## 2019-10-09 MED ORDER — DIVALPROEX SODIUM 250 MG PO DR TAB
250.0000 mg | DELAYED_RELEASE_TABLET | Freq: Two times a day (BID) | ORAL | Status: DC
  Administered 2019-10-09: 08:00:00 250 mg via ORAL
  Filled 2019-10-09 (×2): qty 1

## 2019-10-09 MED ORDER — SULFAMETHOXAZOLE-TRIMETHOPRIM 800-160 MG PO TABS
1.0000 | ORAL_TABLET | Freq: Two times a day (BID) | ORAL | Status: DC
  Administered 2019-10-09: 08:00:00 1 via ORAL
  Filled 2019-10-09 (×2): qty 1

## 2019-10-09 MED ORDER — ALBUTEROL SULFATE (2.5 MG/3ML) 0.083% IN NEBU: 2.5000 mg | INHALATION_SOLUTION | RESPIRATORY_TRACT | Status: DC | PRN

## 2019-10-09 MED ORDER — GLYCOPYRROLATE 1 MG PO TABS
1.0000 mg | ORAL_TABLET | ORAL | Status: DC | PRN
  Filled 2019-10-09: qty 1

## 2019-10-09 MED ORDER — TRAZODONE HCL 50 MG PO TABS: 50.0000 mg | ORAL_TABLET | Freq: Every day | ORAL | Status: DC

## 2019-10-09 MED ORDER — GLYCOPYRROLATE 0.2 MG/ML IJ SOLN: 0.2000 mg | INTRAMUSCULAR | Status: DC | PRN

## 2019-10-09 MED ORDER — MORPHINE SULFATE (PF) 2 MG/ML IV SOLN
1.0000 mg | INTRAVENOUS | Status: DC | PRN
  Administered 2019-10-10 – 2019-10-11 (×3): 1 mg via INTRAVENOUS
  Filled 2019-10-09 (×5): qty 1

## 2019-10-09 MED ORDER — LEVOTHYROXINE SODIUM 25 MCG PO TABS
25.0000 ug | ORAL_TABLET | Freq: Every day | ORAL | Status: DC
  Administered 2019-10-09: 06:00:00 25 ug via ORAL
  Filled 2019-10-09: qty 1

## 2019-10-09 MED ORDER — OXYCODONE HCL 5 MG PO TABS
10.0000 mg | ORAL_TABLET | Freq: Four times a day (QID) | ORAL | Status: DC | PRN
  Administered 2019-10-09: 10 mg via ORAL
  Filled 2019-10-09: qty 2

## 2019-10-09 MED ORDER — LOSARTAN POTASSIUM 50 MG PO TABS
100.0000 mg | ORAL_TABLET | Freq: Every day | ORAL | Status: DC
  Administered 2019-10-09: 08:00:00 100 mg via ORAL
  Filled 2019-10-09 (×2): qty 2

## 2019-10-09 MED ORDER — BUPROPION HCL ER (XL) 150 MG PO TB24
300.0000 mg | ORAL_TABLET | Freq: Every day | ORAL | Status: DC
  Administered 2019-10-09: 300 mg via ORAL
  Filled 2019-10-09 (×2): qty 2

## 2019-10-09 NOTE — Progress Notes (Signed)
   10/09/19 1300  Clinical Encounter Type  Visited With Family  Visit Type Initial  Referral From Nurse   Chaplain received an OR for prayer for the patient. Upon arrival, the patient was observed to be sleeping soundly. The patient's daughter [name] was at the bedside. The patient's daughter reported that her mother (the patient) had been sleeping since her arrival less than a half hour ago. The patient's daughter inquired about the Dr.'s schedule and wondered if she or he would be making rounds today as she would like to have some updates about her mother's condition/status. The chaplain will relay this to the patient's nurse for follow up.

## 2019-10-09 NOTE — Progress Notes (Signed)
[place] [facility] [place] NAME: Lorraine Vargas    MR#:  [id number]  DATE OF BIRTH:  Nov 12, 1951  SUBJECTIVE:  CHIEF COMPLAINT:   Chief Complaint  Patient presents with   Back Pain   Cough   Shortness of Breath   No new complaint this morning.  Patient sitting up in bed and having breakfast.  REVIEW OF SYSTEMS:  ROS Unobtainable due to underlying dementia  DRUG ALLERGIES:   Allergies  Allergen Reactions   Amlodipine Rash   Penicillin G Nausea And Vomiting        Lisinopril Cough        VITALS:  Blood pressure (!) 131/47, pulse 94, temperature 98 F (36.7 C), temperature source Oral, resp. rate 18, height 5\' 2"  (1.575 m), weight 55.4 kg, SpO2 91 %. PHYSICAL EXAMINATION:  Physical Exam  GENERAL:  68 y.o.-year-old patient lying in the bed with no acute distress.  EYES: Pupils equal, round, reactive to light and accommodation. No scleral icterus. Extraocular muscles intact.  HEENT: Head atraumatic, normocephalic. Oropharynx and nasopharynx clear.  NECK: Supple, no jugular venous distention. No thyroid enlargement, no tenderness.  LUNGS: Normal breath sounds bilaterally, no wheezing, rales,rhonchi or crepitation. No use of accessory muscles of respiration.  CARDIOVASCULAR: S1, S2 normal. No murmurs, rubs, or gallops.  ABDOMEN: Soft, nontender, nondistended. Bowel sounds present. No organomegaly or mass.  EXTREMITIES: No pedal edema, cyanosis, or clubbing.  NEUROLOGIC: Moving all extremities with no focal deficit.  Gait not checked.  PSYCHIATRIC: The patient is alert and oriented x 2.  SKIN: No obvious rash, lesion, or ulcer.   LABORATORY PANEL:  Female CBC Recent Labs  Lab 10/08/19 1749  WBC 21.5*  HGB 10.9*  HCT 35.5*  PLT 363   ------------------------------------------------------------------------------------------------------------------ Chemistries  Recent Labs  Lab 10/08/19 1749  NA 133*  K 4.1  CL 95*  CO2 26   GLUCOSE 122*  BUN 10  CREATININE 0.79  CALCIUM 9.4  AST 19  ALT 15  ALKPHOS 94  BILITOT 0.4   RADIOLOGY:  Ct Chest Wo Contrast  Result Date: 10/08/2019 CLINICAL DATA:  Cough, shortness of breath and thoracic back pain for 2-3 days. No injuries. EXAM: CT CHEST WITHOUT CONTRAST TECHNIQUE: Multidetector CT imaging of the chest was performed following the standard protocol without IV contrast. COMPARISON:  Chest radiograph, 01/19/2019 FINDINGS: Cardiovascular: Heart is normal in size and configuration. No pericardial effusion. Three-vessel coronary artery calcifications. Aorta normal in caliber. There are aortic atherosclerotic calcifications. Mediastinum/Nodes: Superior mediastinal mass, which may be a left upper lobe mass invading the mediastinum. Mass displaces the upper esophagus to the right. It extends from the prevertebral soft tissues to the anterior left apex and thoracic inlet. Mass measures 4.9 x 3.2 x 4.7 cm. Along its oblique axis, has a longest dimension of 5.6 cm. The mass encases the left subclavian artery and contacts the top of the aortic arch. The mass leads to erosion of the left anterior aspect of the T1 vertebra. Mass extends to the left posterior neck base, infiltrating the neck base fat. Prominent prevascular lymph node adjacent to the AP window measures 8 mm short axis. No enlarged mediastinal lymph nodes. Esophagus is mildly distended but otherwise unremarkable. Trachea is unremarkable. Normal visualized thyroid gland. Lungs/Pleura: The above described mass may arise from the left upper lobe and invade the mediastinum. It involves the anteromedial aspect of the left upper lobe. There are adjacent linear and reticular opacities consistent with scarring  or atelectasis. No other evidence of a lung mass. No nodule. Mild apical pleuroparenchymal scarring. Upper lobe centrilobular and paraseptal emphysema. No evidence of pneumonia or pulmonary edema. No pleural effusion or  pneumothorax. Upper Abdomen: No acute findings. No visualized liver or adrenal masses. Musculoskeletal: No fracture.  No primary bone lesion. IMPRESSION: 1. 5.6 cm mass that extends from the left posterosuperior mediastinum into the medial left upper lobe, and to the left neck base. The mass encases the left subclavian artery, displaces the esophagus to the right and causes erosion a portion of the left anterior T1 vertebra. 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 Tissue sampling is indicated. Recommend consultation with cardiothoracic and or pulmonary medicine. 2. Single prominent anterior mediastinal lymph node near the AP window may be metastatic or reactive. 3. No other evidence of metastatic disease. 4. No acute findings. Specifically, no evidence of pneumonia or pulmonary edema. 5. Mild emphysema. Aortic atherosclerosis and coronary artery calcifications. Aortic Atherosclerosis (ICD10-I70.0) and Emphysema (ICD10-J43.9). Electronically Signed   By: Lajean Manes M.D.   On: 10/08/2019 17:03   ASSESSMENT AND PLAN:   68 y.o. female with pertinent past medical history of bipolar disorder, schizophrenia, COPD, diabetes mellitus, GERD, melanoma, hypertension, and CVA presenting to the ED with complaints of back pain, cough, shortness of breath since March 2020.  1. Lung mass - Patient presenting with hemoptysis, SOB, anorexia and weight loss. Hx of prior smoking - Admit to MedSurg unit - CT chest concerning for bronchogenic carcinoma invading the mediastinum - Pain management I discussed with patient's daughter Ms Lorraine Vargas who is also healthcare power of attorney [phone] extensively over the phone.  She stated patient would not wish to be aggressive or chase further diagnostic or therapeutic intervention given her underlying dementia.  She has decided against getting oncology or pulmonary  consultation at this time.  She wishes to proceed with getting palliative care consulted to focus on keeping patient comfortable going forward. Palliative care consult placed.  2. Leukocytosis : WBC 21 - Monitor fever curve.  Patient currently afebrile. - Urine culture on 10/20  showed UTI with Klebsiella Currently on IV ceftriaxone Leukocytosis may also be related to underlying malignancy.  3. History of COPD - No evidence of exacerbation will continue home inhalers  4. Diabetes mellitus - Hold Metformin - SSI  5. Hypothyroidism -continue Synthroid  6. Hypertension - Continue losartan and atenolol  7. Hyperlipidemia - Continue rosuvastatin 20 mg daily  8. Mood disorder/anxiety and depression - Continue Wellbutrin, Depakote and Abilify  9. Dementia - Continue Namenda and donepezil monitor for bradycardia   DVT prophylaxis - Hold off on heparin products due to hemoptysis on presentation SCD  All the records are reviewed and case discussed with Care Management/Social Worker. Management plans discussed with the patient, family and they are in agreement.  CODE STATUS: Full Code  TOTAL TIME TAKING CARE OF THIS PATIENT: 37 minutes.   More than 50% of the time was spent in counseling/coordination of care: YES  POSSIBLE D/C IN 2 DAYS, DEPENDING ON CLINICAL CONDITION.   Lorraine Vargas M.D on 10/09/2019 at 1:54 PM  Between 7am to 6pm - Pager - [phone]  After 6pm go to [email or web address] - Proofreader  [employer] [facility]  Office  [phone]  CC: Primary care physician; Lorraine Roys, DO  Note: This dictation was prepared with Dragon dictation along with smaller phrase technology. Any transcriptional  errors that result from this process are unintentional.

## 2019-10-09 NOTE — Consult Note (Addendum)
Pulmonary Medicine          Date: 10/09/2019,   MRN# [id number] Lorraine Vargas Aug 06, 1951     AdmissionWeight: 56.2 kg                 CurrentWeight: 55.4 kg      CHIEF COMPLAINT:   Lung mass   HISTORY OF PRESENT ILLNESS   This is a pleasant female with a history of bipolar disorder, COPD, dementia, diabetes, GERD, history of melanoma, leg weakness, motion sickness, schizophrenia who came into the ED with complaints of worsening shortness of breath.  She had a CT chest which was showing 5.6 cm mass extending into the left posterior superior mediastinum and encasing the left subclavian artery and displacing them the esophagus with portion of T1 erosion concerning for malignancy.  Patient does report hemoptysis which is nonmassive and chronic cough.  She has a history of lifelong smoking. Pulmonary consultation was placed for evaluation of lung mass. On my arrival patient is unresponsive to sternal rub , stat vitals showed spO2 - 69% , she received nonrebreather mask with improvement of spO2 to 94%.    PAST MEDICAL HISTORY   Past Medical History:  Diagnosis Date   Bipolar 2 disorder (HCC)    COPD (chronic obstructive pulmonary disease) (HCC)    Dementia (HCC)    Diabetes mellitus, type 2 (HCC)    GERD (gastroesophageal reflux disease)    History of melanoma    Hypertension    Left leg weakness    S/P TIA   Motion sickness    cars   Schizophrenia ([place])    Stroke (cerebrum) (HCC)    several TIAs     SURGICAL HISTORY   Past Surgical History:  Procedure Laterality Date   ABDOMINAL HYSTERECTOMY     COLON SURGERY     COLONOSCOPY WITH PROPOFOL N/A 08/26/2019   Procedure: COLONOSCOPY WITH PROPOFOL;  Surgeon: Lucilla Lame, MD;  Location: [place];  Service: Endoscopy;  Laterality: N/A;  Diabetic - oral meds   POLYPECTOMY  08/26/2019   Procedure: POLYPECTOMY;  Surgeon: Lucilla Lame, MD;  Location: [place];  Service:  Endoscopy;;     FAMILY HISTORY   Family History  Problem Relation Age of Onset   Dementia Mother    Congestive Heart Failure Father    Diabetes Father    Heart disease Sister      SOCIAL HISTORY   Social History   Tobacco Use   Smoking status: Former Smoker    Quit date: 09/18/2018    Years since quitting: 1.0   Smokeless tobacco: Never Used  Substance Use Topics   Alcohol use: Not Currently    Frequency: Never   Drug use: Never     MEDICATIONS    Home Medication:    Current Medication:  Current Facility-Administered Medications:    0.9 %  sodium chloride infusion, , Intravenous, Continuous, Ouma, Bing Neighbors, NP, Last Rate: 75 mL/hr at 10/09/19 1705   acetaminophen (TYLENOL) tablet 650 mg, 650 mg, Oral, Q6H PRN, Ouma, Bing Neighbors, NP   ARIPiprazole (ABILIFY) tablet 10 mg, 10 mg, Oral, Daily, Ouma, Bing Neighbors, NP, 10 mg at 10/09/19 0747   atenolol (TENORMIN) tablet 50 mg, 50 mg, Oral, Daily, Ouma, Bing Neighbors, NP, 50 mg at 10/09/19 0747   buPROPion (WELLBUTRIN XL) 24 hr tablet 300 mg, 300 mg, Oral, Daily, Ouma, Bing Neighbors, NP, 300 mg at 10/09/19 0748   cefTRIAXone (ROCEPHIN) 1 g in  sodium chloride 0.9 % 100 mL IVPB, 1 g, Intravenous, Q24H, Ouma, Bing Neighbors, NP, Last Rate: 200 mL/hr at 10/09/19 8101, 1 g at 10/09/19 7510   Dextromethorphan-quiNIDine (NUEDEXTA) 20-10 MG per capsule 1 capsule, 1 capsule, Oral, Daily, Ouma, Bing Neighbors, NP   divalproex (DEPAKOTE) DR tablet 250 mg, 250 mg, Oral, BID, Ouma, Bing Neighbors, NP, 250 mg at 10/09/19 0744   donepezil (ARICEPT) tablet 5 mg, 5 mg, Oral, QHS, Ouma, Bing Neighbors, NP   gabapentin (NEURONTIN) capsule 600 mg, 600 mg, Oral, TID, Lang Snow, NP, 600 mg at 10/09/19 1706   levothyroxine (SYNTHROID) tablet 25 mcg, 25 mcg, Oral, Q0600, Lang Snow, NP, 25 mcg at 10/09/19 2585   losartan (COZAAR) tablet 100 mg, 100 mg, Oral,  Daily, Ouma, Bing Neighbors, NP, 100 mg at 10/09/19 0751   memantine (NAMENDA) tablet 10 mg, 10 mg, Oral, BID, Ouma, Bing Neighbors, NP, 10 mg at 10/09/19 0745   mirtazapine (REMERON) tablet 30 mg, 30 mg, Oral, QHS, Ouma, Bing Neighbors, NP   oxyCODONE (Oxy IR/ROXICODONE) immediate release tablet 10 mg, 10 mg, Oral, Q6H PRN, Lang Snow, NP, 10 mg at 10/09/19 0756   rosuvastatin (CRESTOR) tablet 20 mg, 20 mg, Oral, Daily, Ouma, Bing Neighbors, NP, 20 mg at 10/09/19 1706   sulfamethoxazole-trimethoprim (BACTRIM DS) 800-160 MG per tablet 1 tablet, 1 tablet, Oral, BID, Ouma, Bing Neighbors, NP, 1 tablet at 10/09/19 0746   traZODone (DESYREL) tablet 50 mg, 50 mg, Oral, QHS, Ouma, Bing Neighbors, NP    ALLERGIES   Amlodipine, Penicillin g, and Lisinopril     REVIEW OF SYSTEMS    Review of Systems:  Gen:  Denies  fever, sweats, chills weigh loss  HEENT: Denies blurred vision, double vision, ear pain, eye pain, hearing loss, nose bleeds, sore throat Cardiac:  No dizziness, chest pain or heaviness, chest tightness,edema Resp:   Denies cough or sputum porduction, shortness of breath,wheezing, hemoptysis,  Gi: Denies swallowing difficulty, stomach pain, nausea or vomiting, diarrhea, constipation, bowel incontinence Gu:  Denies bladder incontinence, burning urine Ext:   Denies Joint pain, stiffness or swelling Skin: Denies  skin rash, easy bruising or bleeding or hives Endoc:  Denies polyuria, polydipsia , polyphagia or weight change Psych:   Denies depression, insomnia or hallucinations   Other:  All other systems negative   VS: BP (!) 98/42    Pulse 87    Temp 100 F (37.8 C) (Oral)    Resp 20    Ht 5\' 2"  (1.575 m)    Wt 55.4 kg    SpO2 (!) 71%    BMI 22.34 kg/m      PHYSICAL EXAM    GENERAL:NAD, no fevers, chills, no weakness no fatigue HEAD: Normocephalic, atraumatic.  EYES: Pupils equal, round, reactive to light. Extraocular muscles intact.  No scleral icterus.  MOUTH: Moist mucosal membrane. Dentition intact. No abscess noted.  EAR, NOSE, THROAT: Clear without exudates. No external lesions.  NECK: Supple. No thyromegaly. No nodules. No JVD.  PULMONARY: bilateral rhochi  CARDIOVASCULAR: S1 and S2. Regular rate and rhythm. No murmurs, rubs, or gallops. No edema. Pedal pulses 2+ bilaterally.  GASTROINTESTINAL: Soft, nontender, nondistended. No masses. Positive bowel sounds. No hepatosplenomegaly.  MUSCULOSKELETAL: No swelling, clubbing, or edema. Range of motion full in all extremities.  NEUROLOGIC: Cranial nerves II through XII are intact. No gross focal neurological deficits. Sensation intact. Reflexes intact.  SKIN: No ulceration, lesions, rashes, or cyanosis. Skin warm and dry. Turgor intact.  PSYCHIATRIC:  Mood, affect within normal limits. The patient is awake, alert and oriented x 3. Insight, judgment intact.       IMAGING    Ct Chest Wo Contrast  Result Date: 10/08/2019 CLINICAL DATA:  Cough, shortness of breath and thoracic back pain for 2-3 days. No injuries. EXAM: CT CHEST WITHOUT CONTRAST TECHNIQUE: Multidetector CT imaging of the chest was performed following the standard protocol without IV contrast. COMPARISON:  Chest radiograph, 01/19/2019 FINDINGS: Cardiovascular: Heart is normal in size and configuration. No pericardial effusion. Three-vessel coronary artery calcifications. Aorta normal in caliber. There are aortic atherosclerotic calcifications. Mediastinum/Nodes: Superior mediastinal mass, which may be a left upper lobe mass invading the mediastinum. Mass displaces the upper esophagus to the right. It extends from the prevertebral soft tissues to the anterior left apex and thoracic inlet. Mass measures 4.9 x 3.2 x 4.7 cm. Along its oblique axis, has a longest dimension of 5.6 cm. The mass encases the left subclavian artery and contacts the top of the aortic arch. The mass leads to erosion of the left anterior aspect  of the T1 vertebra. Mass extends to the left posterior neck base, infiltrating the neck base fat. Prominent prevascular lymph node adjacent to the AP window measures 8 mm short axis. No enlarged mediastinal lymph nodes. Esophagus is mildly distended but otherwise unremarkable. Trachea is unremarkable. Normal visualized thyroid gland. Lungs/Pleura: The above described mass may arise from the left upper lobe and invade the mediastinum. It involves the anteromedial aspect of the left upper lobe. There are adjacent linear and reticular opacities consistent with scarring or atelectasis. No other evidence of a lung mass. No nodule. Mild apical pleuroparenchymal scarring. Upper lobe centrilobular and paraseptal emphysema. No evidence of pneumonia or pulmonary edema. No pleural effusion or pneumothorax. Upper Abdomen: No acute findings. No visualized liver or adrenal masses. Musculoskeletal: No fracture.  No primary bone lesion. IMPRESSION: 1. 5.6 cm mass that extends from the left posterosuperior mediastinum into the medial left upper lobe, and to the left neck base. The mass encases the left subclavian artery, displaces the esophagus to the right and causes erosion a portion of the left anterior T1 vertebra. 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 Tissue sampling is indicated. Recommend consultation with cardiothoracic and or pulmonary medicine. 2. Single prominent anterior mediastinal lymph node near the AP window may be metastatic or reactive. 3. No other evidence of metastatic disease. 4. No acute findings. Specifically, no evidence of pneumonia or pulmonary edema. 5. Mild emphysema. Aortic atherosclerosis and coronary artery calcifications. Aortic Atherosclerosis (ICD10-I70.0) and Emphysema (ICD10-J43.9). Electronically Signed   By: Lajean Manes M.D.   On: 10/08/2019 17:03        ASSESSMENT/PLAN    Altered mental status   -patient received nacrotics earlier today, unsure if her obtunded state is related to this vs , encephalopathy secondary to acute hypoxemic/hypercapnic resp failure, vs acute CVA   - she had visitor today , unclear if anything else was taken by patient from outside hospital-urine drug screen   - stat narcan and if mental status does not improve and if O2 continues to decrease please transfer to SDU/MICU   - ABG- stat     -CT head stat   - discussed patients condition with daughter [name] who reports this is not her usual state of mind and will come in to hospital .    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   Patient/Family are satisfied with care plan and all questions have been answered.  This document was prepared using Dragon voice recognition software and may include unintentional dictation errors.     Ottie Glazier, M.D.  Division of [facility]

## 2019-10-09 NOTE — H&P (Addendum)
[facility] NAME: Lorraine Vargas    MR#:  [id number]  DATE OF BIRTH:  January 28, 1951  DATE OF ADMISSION:  10/08/2019  PRIMARY CARE PHYSICIAN: Valerie Roys, DO   REQUESTING/REFERRING PHYSICIAN: Marjean Donna MD  CHIEF COMPLAINT:   Chief Complaint  Patient presents with   Back Pain   Cough   Shortness of Breath    HISTORY OF PRESENT ILLNESS:  68 y.o female with pertinent past medical history of bipolar disorder, schizophrenia, COPD, diabetes mellitus, GERD, melanoma, hypertension, and CVA presenting to the ED with complaints of back pain, cough, shortness of breath since March 2020.  Patient has history of dementia so history mostly obtained from patient's daughter who is currently at the bedside.  Per patient's daughter, patient has had intermittent back pain, hemoptysis, shortness of breath, anorexia and weight loss since March 2020.  She was evaluated by her PCP yesterday for symptoms in the CT chest was ordered for evaluation of a possible neoplasm given history of smoking.  She was also started on Bactrim for acute cystitis.  CT chest showed 5.6 mass that extends into the left posterior superior mediastinum it encases the left subclavian artery and displaces the esophagus and also causes erosion of the portion of the left anterior T1 was concerning for malignancy.  Given this finding patient was sent to the ED for further evaluation.  On arrival to the ED, she was afebrile with blood pressure 166/58 mm Hg and pulse rate 87 beats/min. There were no focal neurological deficits; she was alert and oriented x 1.  Her labs revealed WBC 22.1, hemoglobin 9.6, hematocrit 28.9, sodium 133, covid 19 negative.  Hospitalist asked to admit for further management.  PAST MEDICAL HISTORY:   Past Medical History:  Diagnosis Date   Bipolar 2 disorder (HCC)    COPD (chronic obstructive pulmonary disease) (HCC)    Dementia (HCC)    Diabetes  mellitus, type 2 (HCC)    GERD (gastroesophageal reflux disease)    History of melanoma    Hypertension    Left leg weakness    S/P TIA   Motion sickness    cars   Schizophrenia ([place])    Stroke (cerebrum) (HCC)    several TIAs    PAST SURGICAL HISTORY:   Past Surgical History:  Procedure Laterality Date   ABDOMINAL HYSTERECTOMY     COLON SURGERY     COLONOSCOPY WITH PROPOFOL N/A 08/26/2019   Procedure: COLONOSCOPY WITH PROPOFOL;  Surgeon: Lucilla Lame, MD;  Location: [place];  Service: Endoscopy;  Laterality: N/A;  Diabetic - oral meds   POLYPECTOMY  08/26/2019   Procedure: POLYPECTOMY;  Surgeon: Lucilla Lame, MD;  Location: [facility];  Service: Endoscopy;;    SOCIAL HISTORY:   Social History   Tobacco Use   Smoking status: Former Smoker    Quit date: 09/18/2018    Years since quitting: 1.0   Smokeless tobacco: Never Used  Substance Use Topics   Alcohol use: Not Currently    Frequency: Never    FAMILY HISTORY:   Family History  Problem Relation Age of Onset   Dementia Mother    Congestive Heart Failure Father    Diabetes Father    Heart disease Sister     DRUG ALLERGIES:   Allergies  Allergen Reactions   Amlodipine Rash   Penicillin G Nausea And Vomiting        Lisinopril Cough  REVIEW OF SYSTEMS:   Review of Systems  Unable to perform ROS: Dementia   MEDICATIONS AT HOME:   Prior to Admission medications   Medication Sig Start Date End Date Taking? Authorizing Provider  ARIPiprazole (ABILIFY) 5 MG tablet Take 10 mg by mouth daily.  09/15/18  Yes [provider]  atenolol (TENORMIN) 100 MG tablet Take 0.5 tablets (50 mg total) by mouth daily. 09/08/19 09/07/20 Yes Johnson, Megan P, DO  buPROPion (WELLBUTRIN XL) 300 MG 24 hr tablet TAKE 1 TABLET BY MOUTH ONCE DAILY IN THE MORNING 09/07/19  Yes [provider]  Dextromethorphan-quiNIDine (NUEDEXTA) 20-10 MG capsule Take 1 capsule by mouth  daily. Will increase to bid on 01/22/19   Yes [provider]  divalproex (DEPAKOTE) 125 MG DR tablet Take 250 mg by mouth 2 (two) times daily.   Yes [provider]  donepezil (ARICEPT) 5 MG tablet Take 5 mg by mouth at bedtime. 08/31/19  Yes [provider]  gabapentin (NEURONTIN) 400 MG capsule Take 1 capsule (400 mg total) by mouth 3 (three) times daily. Patient taking differently: Take 600 mg by mouth 3 (three) times daily.  07/27/19  Yes Cannady, Jolene T, NP  Ibuprofen 200 MG CAPS Take 2 capsules (400 mg total) by mouth every 4 (four) hours as needed. daily 08/10/19  Yes Johnson, Megan P, DO  levothyroxine (SYNTHROID) 25 MCG tablet Take by mouth.   Yes [provider]  losartan (COZAAR) 100 MG tablet Take 1 tablet (100 mg total) by mouth daily. 07/05/19 07/04/20 Yes Johnson, Megan P, DO  memantine (NAMENDA) 10 MG tablet Take 10 mg by mouth 2 (two) times daily. 07/06/19  Yes [provider]  metFORMIN (GLUCOPHAGE-XR) 500 MG 24 hr tablet Take 1 tablet (500 mg total) by mouth 2 (two) times daily. 07/19/19  Yes Johnson, Megan P, DO  mirtazapine (REMERON) 15 MG tablet TAKE 2 TABLETS BY MOUTH AT BEDTIME FOR 1 WEEK THEN 1 TABLET AT BEDTIME FOR 1 WEEK THEN DISCONTINUE 08/31/19  Yes [provider]  rosuvastatin (CRESTOR) 20 MG tablet Take 1 tablet (20 mg total) by mouth daily. 05/28/19 05/27/20 Yes Johnson, Megan P, DO  sulfamethoxazole-trimethoprim (BACTRIM DS) 800-160 MG tablet Take 1 tablet by mouth 2 (two) times daily. 10/08/19  Yes Johnson, Megan P, DO  traZODone (DESYREL) 50 MG tablet TAKE 1 TABLET BY MOUTH ONCE DAILY AT BEDTIME. FOR SLEEP MAY INCREASE TO 4 TABLETS IF NEEDED 08/31/19  Yes [provider]  albuterol (PROVENTIL HFA;VENTOLIN HFA) 108 (90 Base) MCG/ACT inhaler Inhale 1-2 puffs into the lungs every 6 (six) hours as needed for wheezing or shortness of breath. Use with spacer Patient not taking: Reported on 10/08/2019 01/19/19   Lorin Picket, PA-C      VITAL SIGNS:  Blood pressure (!) 154/58, pulse 99, temperature 98.7 F (37.1 C), temperature source Oral, resp. rate 15, height 5\' 2"  (1.575 m), weight 55.4 kg, SpO2 97 %.  PHYSICAL EXAMINATION:   Physical Exam  GENERAL:  68 y.o.-year-old patient lying in the bed with no acute distress.  EYES: Pupils equal, round, reactive to light and accommodation. No scleral icterus. Extraocular muscles intact.  HEENT: Head atraumatic, normocephalic. Oropharynx and nasopharynx clear.  NECK:  Supple, no jugular venous distention. No thyroid enlargement, no tenderness.  LUNGS: Normal breath sounds bilaterally, no wheezing, rales,rhonchi or crepitation. No use of accessory muscles of respiration.  CARDIOVASCULAR: S1, S2 normal. No murmurs, rubs, or gallops.  ABDOMEN: Soft, nontender, nondistended. Bowel sounds  present. No organomegaly or mass.  EXTREMITIES: No pedal edema, cyanosis, or clubbing.  NEUROLOGIC: Cranial nerves II through XII are intact. Muscle strength 5/5 in all extremities. Sensation intact. Gait not checked.  PSYCHIATRIC: The patient is alert and oriented x 3.  SKIN: No obvious rash, lesion, or ulcer.   DATA REVIEWED:  LABORATORY PANEL:   CBC Recent Labs  Lab 10/08/19 1749  WBC 21.5*  HGB 10.9*  HCT 35.5*  PLT 363   ------------------------------------------------------------------------------------------------------------------  Chemistries  Recent Labs  Lab 10/08/19 1749  NA 133*  K 4.1  CL 95*  CO2 26  GLUCOSE 122*  BUN 10  CREATININE 0.79  CALCIUM 9.4  AST 19  ALT 15  ALKPHOS 94  BILITOT 0.4   ------------------------------------------------------------------------------------------------------------------  Cardiac Enzymes No results for input(s): TROPONINI in the last 168 hours. ------------------------------------------------------------------------------------------------------------------  RADIOLOGY:  Ct Chest Wo  Contrast  Result Date: 10/08/2019 CLINICAL DATA:  Cough, shortness of breath and thoracic back pain for 2-3 days. No injuries. EXAM: CT CHEST WITHOUT CONTRAST TECHNIQUE: Multidetector CT imaging of the chest was performed following the standard protocol without IV contrast. COMPARISON:  Chest radiograph, 01/19/2019 FINDINGS: Cardiovascular: Heart is normal in size and configuration. No pericardial effusion. Three-vessel coronary artery calcifications. Aorta normal in caliber. There are aortic atherosclerotic calcifications. Mediastinum/Nodes: Superior mediastinal mass, which may be a left upper lobe mass invading the mediastinum. Mass displaces the upper esophagus to the right. It extends from the prevertebral soft tissues to the anterior left apex and thoracic inlet. Mass measures 4.9 x 3.2 x 4.7 cm. Along its oblique axis, has a longest dimension of 5.6 cm. The mass encases the left subclavian artery and contacts the top of the aortic arch. The mass leads to erosion of the left anterior aspect of the T1 vertebra. Mass extends to the left posterior neck base, infiltrating the neck base fat. Prominent prevascular lymph node adjacent to the AP window measures 8 mm short axis. No enlarged mediastinal lymph nodes. Esophagus is mildly distended but otherwise unremarkable. Trachea is unremarkable. Normal visualized thyroid gland. Lungs/Pleura: The above described mass may arise from the left upper lobe and invade the mediastinum. It involves the anteromedial aspect of the left upper lobe. There are adjacent linear and reticular opacities consistent with scarring or atelectasis. No other evidence of a lung mass. No nodule. Mild apical pleuroparenchymal scarring. Upper lobe centrilobular and paraseptal emphysema. No evidence of pneumonia or pulmonary edema. No pleural effusion or pneumothorax. Upper Abdomen: No acute findings. No visualized liver or adrenal masses. Musculoskeletal: No fracture.  No primary bone lesion.  IMPRESSION: 1. 5.6 cm mass that extends from the left posterosuperior mediastinum into the medial left upper lobe, and to the left neck base. The mass encases the left subclavian artery, displaces the esophagus to the right and causes erosion a portion of the left anterior T1 vertebra. 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 Tissue sampling is indicated. Recommend consultation with cardiothoracic and or pulmonary medicine. 2. Single prominent anterior mediastinal lymph node near the AP window may be metastatic or reactive. 3. No other evidence of metastatic disease. 4. No acute findings. Specifically, no evidence of pneumonia or pulmonary edema. 5. Mild emphysema. Aortic atherosclerosis and coronary artery calcifications. Aortic Atherosclerosis (ICD10-I70.0) and Emphysema (ICD10-J43.9). Electronically Signed   By: Lajean Manes M.D.   On: 10/08/2019 17:03    EKG:   EKG: normal sinus rate of 85,  no ST elevation, no T wave inversion, normal intervals  IMPRESSION AND PLAN:   68 y.o. female with pertinent past medical history of bipolar disorder, schizophrenia, COPD, diabetes mellitus, GERD, melanoma, hypertension, and CVA presenting to the ED with complaints of back pain, cough, shortness of breath since March 2020.  1. Lung mass - Patient presenting with hemoptysis, SOB, anorexia and weight loss. Hx of prior smoking - Admit to MedSurg unit - CT chest concerning for bronchogenic carcinoma invading the mediastinum - Pain management - We will place pulmonary consult - Findings discussed with patient's daughter still unsure if she would like additional testing/treatment or focus on palliative and pain control given history of dementia.  2. Leukocytosis : WBC 21 - Monitor fever curve - Urine culture on 10/20  showed UTI with Klebsiella - Start ceftriaxone - Trend WBC's and Procalcitonin  3.  History of COPD - No evidence of exacerbation will continue home inhalers  4. Diabetes mellitus - Hold Metformin - SSI  5. Hypothyroidism -continue Synthroid  6. Hypertension - Continue losartan and atenolol  7. Hyperlipidemia - Continue rosuvastatin 20 mg daily  8. Mood disorder/anxiety and depression - Continue Wellbutrin, Depakote and Abilify  9. Dementia - Continue Namenda and donepezil monitor for bradycardia    DVT prophylaxis - Hold anti-coagulation due to hemoptysis  All the records are reviewed and case discussed with ED provider. Management plans discussed with the patient, family and they are in agreement.  CODE STATUS: FULL  TOTAL TIME TAKING CARE OF THIS PATIENT: 50 minutes.    on 10/09/2019 at [place], DNP, FNP-BC [facility] Nurse Practitioner Between 7am to 6pm - Pager [phone]  After 6pm go to [email or web address] - password [email or web address]  Office  [phone]  CC: Primary care physician; Valerie Roys, DO

## 2019-10-10 MED ORDER — GABAPENTIN 300 MG PO CAPS
600.0000 mg | ORAL_CAPSULE | Freq: Three times a day (TID) | ORAL | Status: DC
  Administered 2019-10-10 – 2019-10-13 (×9): 600 mg via ORAL
  Filled 2019-10-10 (×9): qty 2

## 2019-10-10 MED ORDER — BUPROPION HCL ER (XL) 150 MG PO TB24
300.0000 mg | ORAL_TABLET | Freq: Every day | ORAL | Status: DC
  Administered 2019-10-10 – 2019-10-13 (×4): 300 mg via ORAL
  Filled 2019-10-10 (×4): qty 2

## 2019-10-10 MED ORDER — ATENOLOL 50 MG PO TABS
50.0000 mg | ORAL_TABLET | Freq: Every day | ORAL | Status: DC
  Administered 2019-10-10 – 2019-10-11 (×2): 50 mg via ORAL
  Filled 2019-10-10 (×2): qty 1

## 2019-10-10 MED ORDER — LORAZEPAM 1 MG PO TABS
1.0000 mg | ORAL_TABLET | Freq: Once | ORAL | Status: AC
  Administered 2019-10-10: 17:00:00 1 mg via ORAL
  Filled 2019-10-10: qty 1

## 2019-10-10 MED ORDER — SODIUM CHLORIDE 0.9 % IV SOLN
1.0000 g | INTRAVENOUS | Status: DC
  Administered 2019-10-10 – 2019-10-12 (×3): 1 g via INTRAVENOUS
  Filled 2019-10-10: qty 1
  Filled 2019-10-10: qty 10
  Filled 2019-10-10 (×2): qty 1

## 2019-10-10 MED ORDER — ENOXAPARIN SODIUM 40 MG/0.4ML ~~LOC~~ SOLN
40.0000 mg | SUBCUTANEOUS | Status: DC
  Administered 2019-10-10: 21:00:00 40 mg via SUBCUTANEOUS
  Filled 2019-10-10: qty 0.4

## 2019-10-10 MED ORDER — LOSARTAN POTASSIUM 50 MG PO TABS
100.0000 mg | ORAL_TABLET | Freq: Every day | ORAL | Status: DC
  Administered 2019-10-10 – 2019-10-11 (×2): 100 mg via ORAL
  Filled 2019-10-10 (×2): qty 2

## 2019-10-10 MED ORDER — DIVALPROEX SODIUM 250 MG PO DR TAB
250.0000 mg | DELAYED_RELEASE_TABLET | Freq: Two times a day (BID) | ORAL | Status: DC
  Administered 2019-10-10 – 2019-10-13 (×7): 250 mg via ORAL
  Filled 2019-10-10 (×9): qty 1

## 2019-10-10 MED ORDER — SODIUM CHLORIDE 0.9 % IV SOLN
INTRAVENOUS | Status: DC | PRN
  Administered 2019-10-10: 21:00:00 250 mL via INTRAVENOUS

## 2019-10-10 MED ORDER — TRAZODONE HCL 50 MG PO TABS
50.0000 mg | ORAL_TABLET | Freq: Every day | ORAL | Status: DC
  Administered 2019-10-10 – 2019-10-12 (×3): 50 mg via ORAL
  Filled 2019-10-10 (×3): qty 1

## 2019-10-10 MED ORDER — ORAL CARE MOUTH RINSE
15.0000 mL | Freq: Two times a day (BID) | OROMUCOSAL | Status: DC
  Administered 2019-10-10 – 2019-10-12 (×4): 15 mL via OROMUCOSAL

## 2019-10-10 MED ORDER — ARIPIPRAZOLE 10 MG PO TABS
10.0000 mg | ORAL_TABLET | Freq: Every day | ORAL | Status: DC
  Administered 2019-10-10 – 2019-10-13 (×4): 10 mg via ORAL
  Filled 2019-10-10 (×4): qty 1

## 2019-10-10 MED ORDER — MIRTAZAPINE 15 MG PO TABS
30.0000 mg | ORAL_TABLET | Freq: Every day | ORAL | Status: DC
  Administered 2019-10-10 – 2019-10-12 (×3): 30 mg via ORAL
  Filled 2019-10-10 (×3): qty 2

## 2019-10-10 MED ORDER — ROSUVASTATIN CALCIUM 20 MG PO TABS
20.0000 mg | ORAL_TABLET | Freq: Every day | ORAL | Status: DC
  Administered 2019-10-10: 17:00:00 20 mg via ORAL
  Filled 2019-10-10: qty 1

## 2019-10-10 MED ORDER — DEXTROMETHORPHAN-QUINIDINE 20-10 MG PO CAPS
1.0000 | ORAL_CAPSULE | Freq: Every day | ORAL | Status: DC
  Administered 2019-10-10 – 2019-10-13 (×3): 1 via ORAL
  Filled 2019-10-10 (×6): qty 1

## 2019-10-10 NOTE — Progress Notes (Signed)
Pt awaken. Alert to self only, but follow commands. Pleasant. Swallow screen performed per Dr Stark Jock.  Pt passed the swallow screen, regular diet ordered.

## 2019-10-10 NOTE — Progress Notes (Addendum)
Pt c/o L chest pain,L arm pain, bilateral shoulders and neck. Per daughter L arm pain and shoulders is chronic pain. Pt is very anxious. VSS. Morphine given.  Dr Stark Jock made aware. No new orders.   Pt denies pain on reassessment.  Resting comfortably.

## 2019-10-10 NOTE — Progress Notes (Signed)
[facility] NAME: Lorraine Vargas    MR#:  [id number]  DATE OF BIRTH:  1951-05-10  SUBJECTIVE:  CHIEF COMPLAINT:   Chief Complaint  Patient presents with  . Back Pain  . Cough  . Shortness of Breath   Last night patient was reported to have had change in mental status became less responsive.  Was also hypoxic.  Stat CT scan of the head done showed subacute right cerebellar infarct and age undetermined left thalamic lacunar infarct and also noted superimposed white matter infarcts or mets with vasogenic edema.  Covering provider last night discussed with daughter who does not wish to pursue further aggressive work-up for management wishes to have palliative care consulted.  Consult for palliative care team already placed. This morning however patient awake and alert and having breakfast and moving all extremities with no focal deficit.  Pleasantly confused at baseline.  REVIEW OF SYSTEMS:  ROS Unobtainable due to underlying dementia  DRUG ALLERGIES:   Allergies  Allergen Reactions  . Amlodipine Rash  . Penicillin G Nausea And Vomiting       . Lisinopril Cough        VITALS:  Blood pressure (!) 122/59, pulse 90, temperature 98 F (36.7 C), temperature source Oral, resp. rate 18, height 5\' 2"  (1.575 m), weight 55.4 kg, SpO2 97 %. PHYSICAL EXAMINATION:  Physical Exam  GENERAL:  68 y.o.-year-old patient lying in the bed with no acute distress.  EYES: Pupils equal, round, reactive to light and accommodation. No scleral icterus. Extraocular muscles intact.  HEENT: Head atraumatic, normocephalic. Oropharynx and nasopharynx clear.  NECK: Supple, no jugular venous distention. No thyroid enlargement, no tenderness.  LUNGS: Normal breath sounds bilaterally, no wheezing, rales,rhonchi or crepitation. No use of accessory muscles of respiration.  CARDIOVASCULAR: S1, S2 normal. No murmurs, rubs, or gallops.  ABDOMEN: Soft, nontender,  nondistended. Bowel sounds present. No organomegaly or mass.  EXTREMITIES: No pedal edema, cyanosis, or clubbing.  NEUROLOGIC: Moving all extremities with no focal deficit.  Gait not checked.  PSYCHIATRIC: The patient is alert and oriented x 2.  SKIN: No obvious rash, lesion, or ulcer.   LABORATORY PANEL:  Female CBC Recent Labs  Lab 10/08/19 1749  WBC 21.5*  HGB 10.9*  HCT 35.5*  PLT 363   ------------------------------------------------------------------------------------------------------------------ Chemistries  Recent Labs  Lab 10/08/19 1749  NA 133*  K 4.1  CL 95*  CO2 26  GLUCOSE 122*  BUN 10  CREATININE 0.79  CALCIUM 9.4  AST 19  ALT 15  ALKPHOS 94  BILITOT 0.4   RADIOLOGY:  Ct Head Wo Contrast  Result Date: 10/09/2019 CLINICAL DATA:  Altered level of consciousness (LOC), unexplained. Last night patient was up and about, tonight barely responsive. EXAM: CT HEAD WITHOUT CONTRAST TECHNIQUE: Contiguous axial images were obtained from the base of the skull through the vertex without intravenous contrast. COMPARISON:  No pertinent prior studies available for comparison. FINDINGS: Brain: There is a 2.5 x 1.4 cm focus of hypodensity within the right cerebellum consistent with infarct, which is age indeterminate and may be acute/subacute (for instance as seen on series 6, image 31). Chronic cortical/subcortical infarct within the left parietooccipital lobe with associated ex vacuo dilatation of the atrium and occipital horn of the left lateral ventricle. Age-indeterminate lacunar infarct within the left thalamus. There is patchy hypoattenuation within the cerebral white matter and pons consist with chronic small vessel ischemic disease. This is somewhat more focally prominent  within portions of the bilateral frontal lobes. Chronic appearing white matter lacunar infarct within the right centrum semiovale extending inferiorly toward the right basal ganglia. No acute intracranial  hemorrhage. No evidence of intracranial mass. No midline shift. Mild generalized parenchymal atrophy. Partially empty sella turcica. Mega cisterna magna. Vascular: No definite hyperdense vessel. Atherosclerotic calcification of the carotid artery siphons and vertebrobasilar system. Skull: Normal. Negative for fracture or focal lesion. Sinuses/Orbits: Visualized orbits demonstrate no acute abnormality. No significant paranasal sinus disease or mastoid effusion at the imaged levels. These results were called by telephone at the time of interpretation on 10/09/2019 at 8:59 pm to provider NP [name], who verbally acknowledged these results. IMPRESSION: 1. Right cerebellar infarct which is age indeterminate and may be acute/subacute. 2. Age-indeterminate left thalamic lacunar infarct. 3. Chronic small vessel ischemic disease which is somewhat more focally prominent within portions of the bilateral frontal lobes. Brain MRI with and without contrast may be obtained to exclude superimposed acute white matter infarct or metastases with vasogenic edema, as clinically warranted. Chronic appearing lacunar infarct within the right centrum semiovale/basal ganglia. 4. Chronic left parietooccipital lobe cortical/subcortical infarct. 5. Mild generalized parenchymal atrophy. Electronically Signed   By: Kellie Simmering DO   On: 10/09/2019 21:02   ASSESSMENT AND PLAN:   68 y.o. female with pertinent past medical history of bipolar disorder, schizophrenia, COPD, diabetes mellitus, GERD, melanoma, hypertension, and CVA presenting to the ED with complaints of back pain, cough, shortness of breath since March 2020.  1. Lung mass - Patient presenting with hemoptysis, SOB, anorexia and weight loss. 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  Initial recommendation was for PET scan  and tissue diagnosis via biopsy. I discussed with patient's daughter [name] who is also healthcare power of attorney [phone] extensively over the phone yesterday.  She stated patient would not wish to be aggressive or chase further diagnostic or therapeutic intervention given her underlying dementia.  She has decided pursuing further work-up or treatment at this time.   She wishes to proceed with getting palliative care consulted to focus on keeping patient comfortable going forward. Palliative care consult placed and they will see patient tomorrow..  2. Leukocytosis : WBC 21 - Monitor fever curve.  Patient currently afebrile. - Urine culture on 10/20  showed UTI with Klebsiella Currently on IV ceftriaxone Leukocytosis may also be related to underlying malignancy.  Focus likely to change to comfort care measures only after palliative care team meets with daughter tomorrow  3. History of COPD - No evidence of exacerbation will continue home inhalers  4. Diabetes mellitus - Hold Metformin - SSI  5. Hypothyroidism -continue Synthroid  6. Hypertension - Continue losartan and atenolol  7. Hyperlipidemia - Continue rosuvastatin 20 mg daily  8. Mood disorder/anxiety and depression - Continue Wellbutrin, Depakote and Abilify  9. Dementia - Continue Namenda and donepezil monitor for bradycardia   DVT prophylaxis - Hold off on heparin products due to hemoptysis on presentation SCD  All the records are reviewed and case discussed with Care Management/Social Worker. Management plans discussed with the patient, family and they are in agreement.  CODE STATUS: DNR  TOTAL TIME TAKING CARE OF THIS PATIENT: 35 minutes.   More than 50% of the time was spent in counseling/coordination of care: YES  POSSIBLE D/C IN 2 DAYS, DEPENDING ON CLINICAL CONDITION.  Indiya Izquierdo M.D on 10/10/2019 at 12:54 PM  Between 7am to 6pm - Pager - [phone]  After 6pm go to [email or web address]  - Proofreader  [facility]  Office  [phone]  CC: Primary care physician; Valerie Roys, DO  Note: This dictation was prepared with Dragon dictation along with smaller phrase technology. Any transcriptional errors that result from this process are unintentional.

## 2019-10-10 NOTE — Patient Care Conference (Signed)
  Goals of Care  Discussion with patient's daughter [name] in the presence of patient's primary nurse  Patient had a change in mental status last evening. STAT CT head was obtained and unfortunately showed subacute right cerebellar infarct, age indeterminate left thalamic lacunar infarct also noted superimposed acute white matter infarct or mets with vasogenic edema.  I had an extensive discussion with the patient's only daughter and POA [name] regarding the new findings and CT head.  Patient's daughter [name] stated that patient has advanced directive indicating her wishes not to pursue aggressive medical management in the event that her diagnosis may not be curable and or if she is demented and unable to make her own decision.  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  They would like to make patient a DNR and consider palliative/hospice care in preparation for comfort care should the patient's condition begin to deteriorate.    [name], DNP, FNP-BC [facility] Nurse Practitioner Between 7am to 6pm - Pager - [phone]  After 6pm go to [email or web address] - Proofreader  [employer]  [phone]

## 2019-10-11 ENCOUNTER — Telehealth: Payer: Self-pay

## 2019-10-11 DIAGNOSIS — Z7189 Other specified counseling: Secondary | ICD-10-CM

## 2019-10-11 DIAGNOSIS — Z515 Encounter for palliative care: Secondary | ICD-10-CM

## 2019-10-11 DIAGNOSIS — C3492 Malignant neoplasm of unspecified part of left bronchus or lung: Secondary | ICD-10-CM

## 2019-10-11 MED ORDER — MORPHINE SULFATE (CONCENTRATE) 10 MG/0.5ML PO SOLN
5.0000 mg | Freq: Three times a day (TID) | ORAL | Status: DC
  Administered 2019-10-11 – 2019-10-12 (×3): 5 mg via ORAL
  Filled 2019-10-11 (×3): qty 0.5

## 2019-10-11 NOTE — Progress Notes (Signed)
Lorraine Vargas is a pending in home palliative care referral who was re-admitted to the hospital prior to scheduling an appointment time. [facility].   Will continue to follow through disposition.    Dimas Aguas, RN Clinical Nurse Liaison [facility] [phone]

## 2019-10-11 NOTE — Telephone Encounter (Signed)
Auth # [id number] Valid 10/08/19.Left information on Brandy's VM

## 2019-10-11 NOTE — Consult Note (Signed)
Consultation Note Date: 10/11/2019   Patient Name: Lorraine Vargas  DOB: January 06, 1951  MRN: [id number]  Age / Sex: 68 y.o., female  PCP: Valerie Roys, DO Referring Physician: Otila Back, MD  Reason for Consultation: Establishing goals of care  HPI/Patient Profile: 68 y.o. female  with past medical history of bipolar, schizophrenia, dementia, h/o CVA, COPD, HTN, diabetes, GERD, melanoma admitted on 10/08/2019 with ongoing back pain, cough, SOB, weight loss and decreased appetite. 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   Clinical Assessment and Goals of Care: I met today at Ms. Tineo's bedside with daughter, [name] along with NP student, Roselyn Reef. Tammy is tearful upon our entering the room. Ms. Cherney is mouth breathing and arouses briefly but agitated and then right back to sleep. Tammy says that she has been like this all day. She is not really eating or drinking. Breathing is shallow.   Discussed with Tammy GOC. She would like to have biopsy to know more about the her mother's diagnosis but does not want to subject her to further testing. We discussed risks vs benefits of treatments and the risks vs benefits of focusing on comfort. Discussed pain management and staying ahead of pain to ensure comfort. I explained comfort care and hospice. Discussed progression at EOL and medical management for comfort vs escalation of care. I also explained hospice options. Home is not an option.   We discussed hospice support for Tammy and her family (52 yo son) for bereavement support even if they never actually have hospice care. Discussed visitation  And Tammy's children would like to come visit again. Given EOL I believe visitation should be expanded and liberalized. Her siblings  would like to visit. I will discuss one time visitation tomorrow for siblings to say goodbye - will discuss with administration tomorrow.   Primary Decision Maker NEXT OF KIN adult daughter Tammy    SUMMARY OF RECOMMENDATIONS   - Comfort care - Consider transition to hospice  Code Status/Advance Care Planning:  DNR   Symptom Management:   PRN medications to ensure comfort. Scheduled morphine to address ongoing back pain.   Palliative Prophylaxis:   Aspiration, Bowel Regimen, Delirium Protocol, Frequent Pain Assessment and Turn Reposition  Additional Recommendations (Limitations, Scope, Preferences):  Full Comfort Care  Psycho-social/Spiritual:   Desire for further Chaplaincy support:yes  Additional Recommendations: Education on Hospice and Grief/Bereavement Support  Prognosis:   < 2 weeks  Discharge Planning: To Be Determined      Primary Diagnoses: Present on Admission: . BC (bronchogenic carcinoma), left ([place])   I have reviewed the medical record, interviewed the patient and family, and examined the patient. The following aspects are pertinent.  Past Medical History:  Diagnosis Date  . Bipolar 2 disorder ([place])   . COPD (chronic obstructive pulmonary disease) ([place])   . Dementia ([place])   . Diabetes mellitus, type 2 ([place])   . GERD (  gastroesophageal reflux disease)   . History of melanoma   . Hypertension   . Left leg weakness    S/P TIA  . Motion sickness    cars  . Schizophrenia ([place])   . 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 Marital status: Married    Spouse name: Not on file  . Number of children: Not on file  . Years of education: Not on file  . Highest education level: Not on file  Occupational History  . Not on file  Social Needs  . Financial resource strain: Not on file  . Food insecurity    Worry: Not on file    Inability: Not on file  . Transportation needs    Medical: Not on file     Non-medical: Not on file  Tobacco Use  . Smoking status: Former Smoker    Quit date: 09/18/2018    Years since quitting: 1.0  . Smokeless tobacco: Never Used  Substance and Sexual Activity  . Alcohol use: Not Currently    Frequency: Never  . Drug use: Never  . Sexual activity: Not on file  Lifestyle  . Physical activity    Days per week: Not on file    Minutes per session: Not on file  . Stress: Not on file  Relationships  . Social Herbalist on phone: Not on file    Gets together: Not on file    Attends religious service: Not on file    Active member of club or organization: Not on file    Attends meetings of clubs or organizations: Not on file    Relationship status: Not on file  Other Topics Concern  . Not on file  Social History Narrative  . Not on file   Family History  Problem Relation Age of Onset  . Dementia Mother   . Congestive Heart Failure Father   . Diabetes Father   . Heart disease Sister    Scheduled Meds: . ARIPiprazole  10 mg Oral Daily  . atenolol  50 mg Oral Daily  . buPROPion  300 mg Oral Daily  . Dextromethorphan-quiNIDine  1 capsule Oral Daily  . divalproex  250 mg Oral BID  . enoxaparin (LOVENOX) injection  40 mg Subcutaneous Q24H  . gabapentin  600 mg Oral TID  . losartan  100 mg Oral Daily  . mouth rinse  15 mL Mouth Rinse BID  . mirtazapine  30 mg Oral QHS  . rosuvastatin  20 mg Oral Daily  . traZODone  50 mg Oral QHS   Continuous Infusions: . sodium chloride Stopped (10/10/19 2154)  . cefTRIAXone (ROCEPHIN)  IV Stopped (10/10/19 2117)   PRN Meds:.sodium chloride, acetaminophen, albuterol, antiseptic oral rinse, glycopyrrolate **OR** glycopyrrolate **OR** glycopyrrolate, morphine injection Allergies  Allergen Reactions  . Amlodipine Rash  . Penicillin G Nausea And Vomiting       . Lisinopril Cough        Review of Systems  Unable to perform ROS: Acuity of condition    Physical Exam Vitals signs and nursing note  reviewed.  Constitutional:      General: She is sleeping. She is not in acute distress.    Appearance: She is ill-appearing.     Comments: Frail   Cardiovascular:     Rate and Rhythm: Normal rate.  Pulmonary:     Effort: No tachypnea, accessory muscle usage or respiratory distress.  Comments: Shallow breathes  Abdominal:     Palpations: Abdomen is soft.  Neurological:     Mental Status: She is lethargic and confused.     Vital Signs: BP (!) 110/44 (BP Location: Left Arm)   Pulse 77   Temp 98.2 F (36.8 C)   Resp 16   Ht '5\' 2"'  (1.575 m)   Wt 55.4 kg   SpO2 97%   BMI 22.34 kg/m  Pain Scale: Faces   Pain Score: 0-No pain   SpO2: SpO2: 97 % O2 Device:SpO2: 97 % O2 Flow Rate: .O2 Flow Rate (L/min): 2 L/min  IO: Intake/output summary:   Intake/Output Summary (Last 24 hours) at 10/11/2019 1445 Last data filed at 10/11/2019 0400 Gross per 24 hour  Intake 106.18 ml  Output -  Net 106.18 ml    LBM:   Baseline Weight: Weight: 56.2 kg Most recent weight: Weight: 55.4 kg     Palliative Assessment/Data: 20%     Time In: 1330 Time Out: 1450 Time Total: 70 min Greater than 50%  of this time was spent counseling and coordinating care related to the above assessment and plan.  Signed by: Vinie Sill, NP Palliative Medicine Team Pager # [phone] (M-F 8a-5p) Team Phone # [phone] (Nights/Weekends)

## 2019-10-11 NOTE — Telephone Encounter (Signed)
Copied from [place] [id number]. Topic: General - Other >> Oct 08, 2019  5:13 PM Yvette Rack wrote: Reason for CRM: Theadora Rama stated pt was sent to [facility] for a CT scan but her insurance requires an authorization. Theadora Rama would like to know if a retro authorization could be done. Cb# [phone]    Routing to referral coordinators.

## 2019-10-11 NOTE — Progress Notes (Signed)
Lorraine Vargas NAME: Lorraine Vargas    MR#:  [id number]  DATE OF BIRTH:  26-Aug-1951  SUBJECTIVE:  CHIEF COMPLAINT:   Chief Complaint  Patient presents with  . Back Pain  . Cough  . Shortness of Breath   No new complaint this morning.  Patient awake and alert this morning.  Pleasantly confused but at baseline.  REVIEW OF SYSTEMS:  ROS Unobtainable due to underlying dementia  DRUG ALLERGIES:   Allergies  Allergen Reactions  . Amlodipine Rash  . Penicillin G Nausea And Vomiting       . Lisinopril Cough        VITALS:  Blood pressure (!) 122/99, pulse 93, temperature 98.2 F (36.8 C), temperature source Oral, resp. rate 18, height 5\' 2"  (1.575 m), weight 55.4 kg, SpO2 93 %. PHYSICAL EXAMINATION:  Physical Exam  GENERAL:  68 y.o.-year-old patient lying in the bed with no acute distress.  EYES: Pupils equal, round, reactive to light and accommodation. No scleral icterus. Extraocular muscles intact.  HEENT: Head atraumatic, normocephalic. Oropharynx and nasopharynx clear.  NECK: Supple, no jugular venous distention. No thyroid enlargement, no tenderness.  LUNGS: Normal breath sounds bilaterally, no wheezing, rales,rhonchi or crepitation. No use of accessory muscles of respiration.  CARDIOVASCULAR: S1, S2 normal. No murmurs, rubs, or gallops.  ABDOMEN: Soft, nontender, nondistended. Bowel sounds present. No organomegaly or mass.  EXTREMITIES: No pedal edema, cyanosis, or clubbing.  NEUROLOGIC: Moving all extremities with no focal deficit.  Gait not checked.  PSYCHIATRIC: The patient is alert and oriented x 1.  SKIN: No obvious rash, lesion, or ulcer.   LABORATORY PANEL:  Female CBC Recent Labs  Lab 10/08/19 1749  WBC 21.5*  HGB 10.9*  HCT 35.5*  PLT 363   ------------------------------------------------------------------------------------------------------------------ Chemistries  Recent Labs  Lab 10/08/19 1749  NA  133*  K 4.1  CL 95*  CO2 26  GLUCOSE 122*  BUN 10  CREATININE 0.79  CALCIUM 9.4  AST 19  ALT 15  ALKPHOS 94  BILITOT 0.4   RADIOLOGY:  No results found. ASSESSMENT AND PLAN:   68 y.o. female with pertinent past medical history of bipolar disorder, schizophrenia, COPD, diabetes mellitus, GERD, melanoma, hypertension, and CVA presenting to the ED with complaints of back pain, cough, shortness of breath since March 2020.  1. Lung mass - Patient presenting with hemoptysis, SOB, anorexia and weight loss. 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  Initial recommendation was for PET scan and tissue diagnosis via biopsy. I discussed with patient's daughter [name] who is also healthcare power of attorney [phone] extensively over the phone yesterday.  She stated patient would not wish to be aggressive or chase further diagnostic or therapeutic intervention given her underlying dementia.  She has decided pursuing further work-up or treatment at this time.   She wishes to proceed with getting palliative care consulted to focus on keeping patient comfortable going forward. Palliative care consult placed and they will see patient and discussed with daughter today.  I anticipate patient would likely be discharged with hospice..  2. Leukocytosis : WBC 21 - Monitor fever curve.  Patient currently afebrile. - Urine culture on 10/20  showed UTI with Klebsiella Currently on IV ceftriaxone Leukocytosis may also be related to underlying malignancy.  Focus likely to change to comfort care  measures only after palliative care team meets with daughter tomorrow  3. History of COPD - No evidence of exacerbation will continue home inhalers  4. Diabetes mellitus - Hold Metformin - SSI  5. Hypothyroidism -continue Synthroid  6. Hypertension - Continue  losartan and atenolol  7. Hyperlipidemia - Continue rosuvastatin 20 mg daily  8. Mood disorder/anxiety and depression - Continue Wellbutrin, Depakote and Abilify  9. Dementia Patient reported to be having some difficulty with swallowing pills.  Namenda and donepezil currently on hold.  DVT prophylaxis - Hold off on heparin products due to hemoptysis on presentation SCD  All the records are reviewed and case discussed with Care Management/Social Worker. I updated patient's daughter as outlined above recently.  Tried to call this morning but no response.  CODE STATUS: DNR  TOTAL TIME TAKING CARE OF THIS PATIENT: 28 minutes.   More than 50% of the time was spent in counseling/coordination of care: YES  POSSIBLE D/C IN 2 DAYS, DEPENDING ON CLINICAL CONDITION.   Kristi Norment M.D on 10/11/2019 at 12:31 PM  Between 7am to 6pm - Pager - [phone]  After 6pm go to [email or web address] - Proofreader  [facility]  Office  [phone]  CC: Primary care physician; Valerie Roys, DO  Note: This dictation was prepared with Dragon dictation along with smaller phrase technology. Any transcriptional errors that result from this process are unintentional.

## 2019-10-12 MED ORDER — LORAZEPAM 2 MG/ML IJ SOLN
1.0000 mg | INTRAMUSCULAR | Status: DC | PRN
  Administered 2019-10-13: 2 mg via INTRAVENOUS
  Administered 2019-10-13: 04:00:00 1 mg via INTRAVENOUS
  Filled 2019-10-12 (×2): qty 1

## 2019-10-12 MED ORDER — MORPHINE SULFATE (PF) 2 MG/ML IV SOLN
2.0000 mg | INTRAVENOUS | Status: DC | PRN
  Administered 2019-10-12 (×2): 2 mg via INTRAVENOUS
  Filled 2019-10-12 (×2): qty 1

## 2019-10-12 MED ORDER — MORPHINE SULFATE (CONCENTRATE) 10 MG/0.5ML PO SOLN
5.0000 mg | ORAL | Status: DC
  Administered 2019-10-12 – 2019-10-13 (×4): 5 mg via ORAL
  Filled 2019-10-12 (×4): qty 0.5

## 2019-10-12 NOTE — Progress Notes (Signed)
[place] at [place] NAME: Lorraine Vargas    MR#:  [id number]  DATE OF BIRTH:  07-Jun-1951  SUBJECTIVE:  CHIEF COMPLAINT:   Chief Complaint  Patient presents with  . Back Pain  . Cough  . Shortness of Breath   No new complaint this morning.  Patient was resting comfortably this morning.  REVIEW OF SYSTEMS:  ROS Unobtainable due to underlying dementia  DRUG ALLERGIES:   Allergies  Allergen Reactions  . Amlodipine Rash  . Penicillin G Nausea And Vomiting       . Lisinopril Cough        VITALS:  Blood pressure (!) 94/38, pulse 84, temperature 98.7 F (37.1 C), temperature source Axillary, resp. rate 17, height 5\' 2"  (1.575 m), weight 55.4 kg, SpO2 97 %. PHYSICAL EXAMINATION:  Physical Exam  GENERAL:  68 y.o.-year-old patient lying in the bed with no acute distress.  EYES: Pupils equal, round, reactive to light and accommodation. No scleral icterus. Extraocular muscles intact.  HEENT: Head atraumatic, normocephalic. Oropharynx and nasopharynx clear.  NECK: Supple, no jugular venous distention. No thyroid enlargement, no tenderness.  LUNGS: Normal breath sounds bilaterally, no wheezing, rales,rhonchi or crepitation. No use of accessory muscles of respiration.  CARDIOVASCULAR: S1, S2 normal. No murmurs, rubs, or gallops.  ABDOMEN: Soft, nontender, nondistended. Bowel sounds present. No organomegaly or mass.  EXTREMITIES: No pedal edema, cyanosis, or clubbing.  NEUROLOGIC: Moving all extremities with no focal deficit.  Gait not checked.  PSYCHIATRIC: The patient is alert and oriented x 1.  SKIN: No obvious rash, lesion, or ulcer.   LABORATORY PANEL:  Female CBC Recent Labs  Lab 10/08/19 1749  WBC 21.5*  HGB 10.9*  HCT 35.5*  PLT 363   ------------------------------------------------------------------------------------------------------------------ Chemistries  Recent Labs  Lab 10/08/19 1749  NA 133*  K 4.1  CL 95*  CO2  26  GLUCOSE 122*  BUN 10  CREATININE 0.79  CALCIUM 9.4  AST 19  ALT 15  ALKPHOS 94  BILITOT 0.4   RADIOLOGY:  No results found. ASSESSMENT AND PLAN:   68 y.o. female with pertinent past medical history of bipolar disorder, schizophrenia, COPD, diabetes mellitus, GERD, melanoma, hypertension, and CVA presenting to the ED with complaints of back pain, cough, shortness of breath since March 2020.  1. Lung mass - Patient presenting with hemoptysis, SOB, anorexia and weight loss. 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  Initial recommendation was for PET scan and tissue diagnosis via biopsy. I discussed with patient's daughter Ms Lorraine Vargas who is also healthcare power of attorney extensively over the phone recently.  She stated patient would not wish to be aggressive or chase further diagnostic or therapeutic intervention given her underlying dementia.  She has decided pursuing further work-up or treatment at this time.   She wishes to proceed with getting palliative care consulted to focus on keeping patient comfortable going forward. Palliative care team following patient.  Patient now on comfort with plans to transition to hospice.  Palliative team to meet with patient and family again today regarding discharge plans  2. Leukocytosis : WBC 21 - Monitor fever curve.  Patient currently afebrile. - Urine culture on 10/20  showed UTI with Klebsiella Currently on IV ceftriaxone to complete treatment duration .leukocytosis may also be related to underlying malignancy.  Focus likely to change  to comfort care measures only after palliative care team meets with daughter tomorrow  3. History of COPD - No evidence of exacerbation will continue home inhalers  4. Diabetes mellitus - Hold Metformin - SSI  5. Hypothyroidism -continue Synthroid  6.  Hypertension - Continue losartan and atenolol  7. Hyperlipidemia - Continue rosuvastatin 20 mg daily  8. Mood disorder/anxiety and depression - Continue Wellbutrin, Depakote and Abilify  9. Dementia Patient reported to be having some difficulty with swallowing pills.  Namenda and donepezil currently on hold.  DVT prophylaxis - Hold off on heparin products due to hemoptysis on presentation SCD  All the records are reviewed and case discussed with Care Management/Social Worker. I updated patient's daughter as outlined above recently.  Tried to call this morning but no response.  CODE STATUS: DNR  TOTAL TIME TAKING CARE OF THIS PATIENT: 26 minutes.   More than 50% of the time was spent in counseling/coordination of care: YES  POSSIBLE D/C IN 2 DAYS, DEPENDING ON CLINICAL CONDITION.   Lorraine Vargas M.D on 10/12/2019 at 12:57 PM  Between 7am to 6pm - Pager - [phone]  After 6pm go to [email or web address] - Proofreader  [facility]  Office  [phone]  CC: Primary care physician; Lorraine Roys, DO  Note: This dictation was prepared with Dragon dictation along with smaller phrase technology. Any transcriptional errors that result from this process are unintentional.

## 2019-10-12 NOTE — Progress Notes (Signed)
Palliative:  HPI: 68 y.o. female  with past medical history of bipolar, schizophrenia, dementia, h/o CVA, COPD, HTN, diabetes, GERD, melanoma admitted on 10/08/2019 with ongoing back pain, cough, SOB, weight loss and decreased appetite. 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 Full comfort care.   I met again today at Lorraine Vargas's bedside with daughter, Lorraine Vargas. We were joined by Lorraine Vargas's sister. We discussed comfort care, poor prognosis, and recommendation for hospice facility. She is not eating/drinking other than a couple bites last night. We discussed BP dropping and expectations as the body shuts down (decreased UOP, changing vitals signs, changing breathing pattern, gurgling, etc). Discussed role of medications to ensure comfort and limited benefit of other aggressive interventions such as IVF, etc.   Lorraine Vargas is concerned about her mother's prognosis and the stress to her to transfer to hospice. We discussed that we always assess to ensure she remains stable for transfer and would provide pain medication to assist with ambulance ride to facility. She will continue to think and speak with her family about this option.   There are many concerns about visitation and allowing siblings to be able to visit her at EOL. I discussed with RN and with unit nursing director, Lorraine Vargas, who will assist to coordinate with family.   All questions/concerns addressed. Emotional support provided.   Exam: Unresponsive. Appears older than stated age. No distress. Shallow breathing with some apneic episodes noted. Extremities warm to touch.   Plan: - Full comfort care.  - Comfort medications in place. Scheduled morphine for ongoing pain control.  - Consider hospice facility.   32YOF  Lorraine Jeffrey, NP Palliative Medicine Team Pager [phone] (Please see [email or web address] for  schedule) Team Phone [phone]    Greater than 50%  of this time was spent counseling and coordinating care related to the above assessment and plan

## 2019-10-12 NOTE — Care Management Important Message (Signed)
Important Message  Patient Details  Name: Lorraine Vargas MRN: [id number] Date of Birth: [birth year]   Medicare Important Message Given:  Yes     Juliann Pulse A Jared Whorley 10/12/2019, 11:19 AM

## 2019-10-12 NOTE — Progress Notes (Signed)
Patient not urinating and has been having this problem. Messaged Dr. Stark Jock and because she is comfort care he ordered Foley catheter to be placed.

## 2019-10-13 ENCOUNTER — Telehealth: Payer: Self-pay | Admitting: Family Medicine

## 2019-10-13 MED ORDER — MORPHINE BOLUS VIA INFUSION
2.0000 mg | INTRAVENOUS | Status: DC | PRN
  Filled 2019-10-13: qty 2

## 2019-10-13 MED ORDER — HALOPERIDOL LACTATE 5 MG/ML IJ SOLN: 2.0000 mg | Freq: Four times a day (QID) | INTRAMUSCULAR | Status: DC | PRN

## 2019-10-13 MED ORDER — LORAZEPAM 2 MG/ML IJ SOLN: 2.0000 mg | INTRAMUSCULAR | Status: DC | PRN

## 2019-10-13 MED ORDER — CHLORPROMAZINE HCL 25 MG/ML IJ SOLN
25.0000 mg | Freq: Three times a day (TID) | INTRAMUSCULAR | Status: DC | PRN
  Filled 2019-10-13: qty 1

## 2019-10-13 MED ORDER — MORPHINE 100MG IN NS 100ML (1MG/ML) PREMIX INFUSION
2.0000 mg/h | INTRAVENOUS | Status: DC
  Administered 2019-10-13: 2 mg/h via INTRAVENOUS
  Filled 2019-10-13: qty 100

## 2019-10-13 MED ORDER — HALOPERIDOL LACTATE 5 MG/ML IJ SOLN
2.0000 mg | Freq: Four times a day (QID) | INTRAMUSCULAR | Status: DC
  Administered 2019-10-13 (×2): 2 mg via INTRAVENOUS
  Filled 2019-10-13: qty 1

## 2019-10-13 NOTE — Telephone Encounter (Signed)
Ivin Booty with [facility] [place] calling to advise pt no longer in OT with them.  She is in the hospital.

## 2019-10-13 NOTE — Progress Notes (Signed)
[place] at [place] NAME: Lorraine Vargas    MR#:  [id number]  DATE OF BIRTH:  Jul 19, 1951  SUBJECTIVE:  CHIEF COMPLAINT:   Chief Complaint  Patient presents with  . Back Pain  . Cough  . Shortness of Breath   Patient reported to be intermittently yelling out in pain.  Discussed with daughter and being started on morphine drip for comfort.  Patient on comfort care measures while awaiting hospice house bed  REVIEW OF SYSTEMS:  ROS Unobtainable due to underlying dementia  DRUG ALLERGIES:   Allergies  Allergen Reactions  . Amlodipine Rash  . Penicillin G Nausea And Vomiting       . Lisinopril Cough        VITALS:  Blood pressure (!) 130/37, pulse 94, temperature 98.6 F (37 C), temperature source Oral, resp. rate 20, height 5\' 2"  (1.575 m), weight 55.4 kg, SpO2 94 %. PHYSICAL EXAMINATION:  Physical Exam  GENERAL:  68 y.o.-year-old patient lying in the bed with no acute distress.  EYES: Pupils equal, round, reactive to light and accommodation. No scleral icterus. Extraocular muscles intact.  HEENT: Head atraumatic, normocephalic. Oropharynx and nasopharynx clear.  NECK: Supple, no jugular venous distention. No thyroid enlargement, no tenderness.  LUNGS: Normal breath sounds bilaterally, no wheezing, rales,rhonchi or crepitation. No use of accessory muscles of respiration.  CARDIOVASCULAR: S1, S2 normal. No murmurs, rubs, or gallops.  ABDOMEN: Soft, nontender, nondistended. Bowel sounds present. No organomegaly or mass.  EXTREMITIES: No pedal edema, cyanosis, or clubbing.  NEUROLOGIC: Moving all extremities with no focal deficit.  Gait not checked.  PSYCHIATRIC: The patient is alert and oriented x 1.  SKIN: No obvious rash, lesion, or ulcer.   LABORATORY PANEL:  Female CBC Recent Labs  Lab 10/08/19 1749  WBC 21.5*  HGB 10.9*  HCT 35.5*  PLT 363    ------------------------------------------------------------------------------------------------------------------ Chemistries  Recent Labs  Lab 10/08/19 1749  NA 133*  K 4.1  CL 95*  CO2 26  GLUCOSE 122*  BUN 10  CREATININE 0.79  CALCIUM 9.4  AST 19  ALT 15  ALKPHOS 94  BILITOT 0.4   RADIOLOGY:  No results found. ASSESSMENT AND PLAN:   68 y.o. female with pertinent past medical history of bipolar disorder, schizophrenia, COPD, diabetes mellitus, GERD, melanoma, hypertension, and CVA presenting to the ED with complaints of back pain, cough, shortness of breath since March 2020.  1. Lung mass - Patient presenting with hemoptysis, SOB, anorexia and weight loss. 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  Initial recommendation was for PET scan and tissue diagnosis via biopsy. I discussed with patient's daughter [name] who is also healthcare power of attorney extensively over the phone recently.  She stated patient would not wish to be aggressive or chase further diagnostic or therapeutic intervention given her underlying dementia.  She has decided pursuing further work-up or treatment at this time.   She wishes to proceed with getting palliative care consulted to focus on keeping patient comfortable going forward. Palliative care team following patient.  Patient now on comfort care I discussed with patient's daughter [name] this morning.  She is agreeable to discharge to hospice house once bed available.  Focus is on keeping patient comfortable going forward.  2. Leukocytosis : WBC 21 - Monitor fever curve.  Patient currently afebrile. -  Urine culture on 10/20  showed UTI with Klebsiella Currently on IV ceftriaxone to complete treatment duration .leukocytosis may also be related to underlying malignancy.  Focus likely to change to comfort  care measures only with plans to transfer to hospice house when bed available  3. History of COPD - No evidence of exacerbation will continue home inhalers  4. Diabetes mellitus - Hold Metformin - SSI  5. Hypothyroidism -continue Synthroid  6. Hypertension - Continue losartan and atenolol  7. Hyperlipidemia - Continue rosuvastatin 20 mg daily  8. Mood disorder/anxiety and depression and dementia Patient not awake enough for safe p.o. intake. now on comfort care measures.  Discontinued p.o. meds   DVT prophylaxis - Hold off on heparin products due to hemoptysis on presentation SCD  All the records are reviewed and case discussed with Care Management/Social Worker. I updated patient's daughter as outlined above recently.  Tried to call this morning but no response.  CODE STATUS: DNR  TOTAL TIME TAKING CARE OF THIS PATIENT: 27 minutes.   More than 50% of the time was spent in counseling/coordination of care: YES  POSSIBLE D/C IN 1-2 DAYS, DEPENDING ON CLINICAL CONDITION.   Mery Guadalupe M.D on 10/13/2019 at 11:54 AM  Between 7am to 6pm - Pager - [phone]  After 6pm go to [email or web address] - Proofreader  [facility]  Office  [phone]  CC: Primary care physician; Valerie Roys, DO  Note: This dictation was prepared with Dragon dictation along with smaller phrase technology. Any transcriptional errors that result from this process are unintentional.

## 2019-10-13 NOTE — Progress Notes (Signed)
   10/13/19 2000  Clinical Encounter Type  Visited With Patient;Patient and family together;Health care provider  Visit Type Follow-up  Referral From Nurse  Spiritual Encounters  Spiritual Needs Prayer;Emotional;Grief support  Stress Factors  Family Stress Factors Health changes;Loss   Chaplain received a referral from the patient's nurse. The patient's nurse reported that the patient had a concerning episode earlier during which she was calling out to be saved and became inconsolable. This chaplain met the patient and her daughter on the first day of her admission, so they are familiar to this Probation officer. Chaplain offered encouraging words, the ministry of compassionate presence, prayer, and scripture to the patient.  While at the bedside, the patient's daughter [name]) arrived. Chaplain and Tammy engaged in a life review and discussed some of the difficulties of this time. The patient's newborn great-granddaughter is in the NICU at [facility] and the family is managing the challenges of these realities. The patient's daughter said that her own daughter is lamenting the fact that her newborn and grandmother are in the same hospital, but due to COVID-19, she is unable to get them together. The patient's daughter said that the patient had recently began to talk about being united with her husband who died 5 years ago. Tammy reported that the patient looks forward to this. Although the patient's daughter is grieving, she has a sense of peace in the believing that her parents will be together again.

## 2019-10-13 NOTE — Progress Notes (Signed)
New referral for [facility] hospice home received from [facility]. Patient information faxed to referral and approval received from hospice medical direct Dr. Gilford Rile. Writer met in the room with patient's daughter Lorraine Vargas to initiate education regarding hospice services, philosophy, team approach to care and current visitation policy with understanding voiced. Plan is for transfer to the hospice home tomorrow via EMS with signed out facility DNR in place. Plan is to continue continuous morphine drip at the hospice home. Will continue to follow through discharge. Hospital care team updated. Flo Shanks BSN, RN, [place] [phone]

## 2019-10-13 NOTE — Progress Notes (Signed)
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    I met again today with Ms. Lorraine Vargas and daughter, [name], at bedside. She has been placed on morphine infusion just recently. She continues to be restless and yelling out at times. I did adjust medications to better achieve comfort. I discussed plan with RN. Also discussed with Tammy transition to hospice facility. Tammy agrees that this will be a good option. Her main concern is comfort for her mother and that it does not cause her mother undo stress for transfer in ambulance. I reassured her that we would premedicate and that if her mother declines further and transfer does not seem appropriate that we will cancel and just keep her here (I reassured her that I have many times cancelled transfer d/t to patient decline).   All questions/concerns addressed. Emotional support provided.   Exam: Minimally responsive. Confused. Not following commands. Breathing shallow, unlabored. Abd soft. Warm to touch.   Plan: - Full comfort care.  - Transition to hospice facility.  - Pain/SOB: Morphine infusion with liberal bolus as needed to achieve comfort.  - Haldol 2 mg IV every 6 hours for psychosis distress. Thorazine IV prn.  - Ativan prn anxiety although this was not very effective to provide relief per RN.   [place], NP Palliative Medicine Team Pager [phone] (Please see [email or web address] for schedule) Team Phone [phone]    Greater than 50%  of this time was spent counseling and coordinating care related to the above assessment and plan

## 2019-10-13 NOTE — Progress Notes (Signed)
Gave patient her morning medications crushed in apple sauce and she was unable to swallow she spit them all back up.

## 2019-10-14 NOTE — Progress Notes (Signed)
Patient transferred to [facility] via EMS. Daughter was at bedside.

## 2019-10-14 NOTE — TOC Transition Note (Signed)
Transition of Care [facility]) - CM/SW Discharge Note   Patient Details  Name: Dalayah Deahl MRN: [id number] Date of Birth: 08/09/1951  [facility] CM/SW Contact:  Shelbie Hutching, RN Phone Number: 10/14/2019, 10:50 AM   Clinical Narrative:     Patient discharging to residential [facility] in [place].  Patient will transport via [employer] crew.    Final next level of care: [place] Barriers to Discharge: Barriers Resolved   Patient Goals and CMS Choice   CMS Medicare.gov Compare Post Acute Care list provided to:: Patient Represenative (must comment)(daughter) Choice offered to / list presented to : Adult Children  Discharge Placement              Patient [facility] bed at: [facility]) Patient to be transferred to facility by: [facility] Name of family member notified: [name]- daughter Patient and family notified of of transfer: 10/14/19  Discharge Plan and Services     Post Acute Care Choice: Hospice                               Social Determinants of Health (SDOH) Interventions     Readmission Risk Interventions No flowsheet data found.

## 2019-10-14 NOTE — Care Management Important Message (Signed)
Important Message  Patient Details  Name: Michaeleen Down MRN: [id number] Date of Birth: 03-14-1951   Medicare Important Message Given:  Other (see comment)  Per MD patient on comfort measures and transfer to [place]. Out of respect for patient and family no IM given.  Juliann Pulse A Sheyna Pettibone 10/14/2019, 9:08 AM

## 2019-10-14 NOTE — Telephone Encounter (Signed)
I am aware  

## 2019-10-14 NOTE — Progress Notes (Signed)
   10/14/19 1000  Clinical Encounter Type  Visited With Patient and family together  Visit Type Follow-up  Referral From Chaplain  Ch followed with the daughter. Daughter had stayed the night at the hospital. Daughter shared about how she's been caring for her mother and a recent incident where the pt left home in search of her father who died 5 years ago. Pt was calmly resting waiting for a transportation to hospice home. Ch blessed the family.

## 2019-10-14 NOTE — Discharge Summary (Signed)
Lorraine Vargas at [place] NAME: Lorraine Vargas    MR#:  [id number]  DATE OF BIRTH:  May 23, 1951  DATE OF ADMISSION:  10/08/2019   ADMITTING PHYSICIAN: Lorraine Snow, NP  DATE OF DISCHARGE: 10/14/2019  PRIMARY CARE PHYSICIAN: Lorraine Liter P, DO   ADMISSION DIAGNOSIS:  Lung mass [R91.8] DISCHARGE DIAGNOSIS:  Active Problems:   Cavitating mass of lung   BC (bronchogenic carcinoma), left ([place])  SECONDARY DIAGNOSIS:   Past Medical History:  Diagnosis Date  . Bipolar 2 disorder ([place])   . COPD (chronic obstructive pulmonary disease) ([place])   . Dementia ([place])   . Diabetes mellitus, type 2 ([place])   . GERD (gastroesophageal reflux disease)   . History of melanoma   . Hypertension   . Left leg weakness    S/Vargas TIA  . Motion sickness    cars  . Schizophrenia ([place])   . 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 Per patient's daughter, patient has had intermittent back pain, hemoptysis, shortness of breath, anorexia and weight loss since March 2020.  She was evaluated by her PCP 1 day prior to admission for symptoms in the CT chest was ordered for evaluation of a possible neoplasm given history of smoking.  CT chest showed 5.6 mass that extends into the left posterior superior mediastinum it encases the left subclavian artery and displaces the esophagus and also causes erosion of the portion of the left anterior T1 was concerning for malignancy.  Given this finding patient was sent to the ED for further evaluation. Hospitalist asked to admit for further management.  Hospital course; 1. Lung  mass - Patient presenting with hemoptysis, SOB, anorexia and weight loss. Hx of prior smoking.CT chest concerning for bronchogenic carcinoma invading the mediastinum. Patient was seen already by pulmonologist .  Initial recommendation was for PET scan and tissue diagnosis via biopsy. I discussed with patient's daughter Ms Lorraine Vargas who is also healthcare power of attorney extensively over the phone recently.  She stated patient would not wish to be aggressive or chase further diagnostic or therapeutic intervention given her underlying dementia.  She has decided pursuing further work-up or treatment at this time. She wishes to proceed with getting palliative care consulted to focus on keeping patient comfortable going forward. Palliative care team following patient.  Patient now on comfort care.I discussed with patient's daughter Ms. Lorraine Vargas.  She is agreeable to discharge to hospice house today.  Updated daughter present at bedside this morning and all questions were answered.  2.Leukocytosis : CZY60 - Monitor fever curve.  Patient currently afebrile. - Urine culture on 10/20 showed UTI with Klebsiella.  Patient was treated with IV ceftriaxone during this admission. Leukocytosis may also be related to underlying malignancy. 3.History of COPD -No evidence of exacerbation will continue home inhalers 4.Diabetes mellitus Being discharged to hospice house. 5.Hypothyroidism, hypertension, hyperlipidemia , anxiety , depression and dementia  Being discharged to hospice house with primary focus on keeping patient comfortable going forward  Patient currently on morphine drip and as needed Haldol which will be continued at the hospice house.     DISCHARGE CONDITIONS:  Stable  CONSULTS OBTAINED:   DRUG ALLERGIES:   Allergies  Allergen Reactions  . Amlodipine Rash  . Penicillin G Nausea And Vomiting       . Lisinopril Cough        DISCHARGE MEDICATIONS:   Allergies as of 10/14/2019       Reactions   Amlodipine Rash   Penicillin G Nausea And Vomiting       Lisinopril Cough          Medication List    STOP taking these medications   ARIPiprazole 5 MG tablet Commonly known as: ABILIFY   atenolol 100 MG tablet Commonly known as: TENORMIN   buPROPion 300 MG 24 hr tablet Commonly known as: WELLBUTRIN XL   divalproex 125 MG DR tablet Commonly known as: DEPAKOTE   donepezil 5 MG tablet Commonly known as: ARICEPT   gabapentin 400 MG capsule Commonly known as: Neurontin   Ibuprofen 200 MG Caps   levothyroxine 25 MCG tablet Commonly known as: SYNTHROID   losartan 100 MG tablet Commonly known as: COZAAR   memantine 10 MG tablet Commonly known as: NAMENDA   metFORMIN 500 MG 24 hr tablet Commonly known as: GLUCOPHAGE-XR   mirtazapine 15 MG tablet Commonly known as: REMERON   Nuedexta 20-10 MG capsule Generic drug: Dextromethorphan-quiNIDine   rosuvastatin 20 MG tablet Commonly known as: CRESTOR   sulfamethoxazole-trimethoprim 800-160 MG tablet Commonly known as: BACTRIM DS   traZODone 50 MG tablet Commonly known as: DESYREL     TAKE these medications   albuterol 108 (90 Base) MCG/ACT inhaler Commonly known as: VENTOLIN HFA Inhale 1-2 puffs into the lungs every 6 (six) hours as needed for wheezing or shortness of breath. Use with spacer        DISCHARGE INSTRUCTIONS:   DIET:  Pleasure feeding DISCHARGE CONDITION:  Serious ACTIVITY:  Bedrest OXYGEN:  Home Oxygen: Yes.    Oxygen Delivery: Oxygen via nasal cannula at 2 L DISCHARGE LOCATION:  Hospice house  If you experience worsening of your admission symptoms, develop shortness of breath, life threatening emergency, suicidal or homicidal thoughts you must seek medical attention immediately by calling 911 or calling your MD immediately  if symptoms less severe.  You Must read complete instructions/literature along with all the possible adverse reactions/side effects for all the  Medicines you take and that have been prescribed to you. Take any new Medicines after you have completely understood and accpet all the possible adverse reactions/side effects.   Please note  You were cared for by a hospitalist during your hospital stay. If you have any questions about your discharge medications or the care you received while you were in the hospital after you are discharged, you can call the unit and asked to speak with the hospitalist on call if the hospitalist that took care of you is not available. Once you are discharged, your primary care physician will handle any further medical issues. Please note that NO REFILLS for any discharge medications will be authorized once you are discharged, as it is imperative that you return to your primary care physician (or establish a relationship with a primary care physician if you do not have one) for your aftercare needs so that they can reassess your need for medications and monitor your lab values.    On the day of Discharge:  VITAL SIGNS:  Blood pressure (!) 89/49, pulse 96, temperature 97.9 F (36.6 C), temperature source Oral, resp. rate 12, height 5\' 2"  (1.575 m), weight 55.4 kg, SpO2  98 %. PHYSICAL EXAMINATION:  GENERAL:  68 y.o.-year-old patient lying in the bed with no acute distress.  Resting comfortably EYES: Pupils equal, round, reactive to light and accommodation. No scleral icterus. Extraocular muscles intact.  HEENT: Head atraumatic, normocephalic. Oropharynx and nasopharynx clear.  NECK:  Supple, no jugular venous distention. No thyroid enlargement, no tenderness.  LUNGS: Normal breath sounds bilaterally, no wheezing, rales,rhonchi or crepitation. No use of accessory muscles of respiration.  CARDIOVASCULAR: S1, S2 normal. No murmurs, rubs, or gallops.  ABDOMEN: Soft, non-tender, non-distended. Bowel sounds present. No organomegaly or mass.  EXTREMITIES: No pedal edema, cyanosis, or clubbing.  NEUROLOGIC: Patient  sedated and resting comfortably on morphine drip .Gait not checked.  PSYCHIATRIC: Patient sedated and resting comfortably on morphine drip   SKIN: No obvious rash, lesion, or ulcer.   DATA REVIEW:   CBC Recent Labs  Lab 10/08/19 1749  WBC 21.5*  HGB 10.9*  HCT 35.5*  PLT 363    Chemistries  Recent Labs  Lab 10/08/19 1749  NA 133*  K 4.1  CL 95*  CO2 26  GLUCOSE 122*  BUN 10  CREATININE 0.79  CALCIUM 9.4  AST 19  ALT 15  ALKPHOS 94  BILITOT 0.4     Microbiology Results  Results for orders placed or performed during the hospital encounter of 10/08/19  SARS Coronavirus 2 by RT PCR (hospital order, performed in [facility] hospital lab) Nasopharyngeal Nasopharyngeal Swab     Status: None   Collection Time: 10/08/19 10:08 PM   Specimen: Nasopharyngeal Swab  Result Value Ref Range Status   SARS Coronavirus 2 NEGATIVE NEGATIVE Final    Comment: (NOTE) If result is NEGATIVE SARS-CoV-2 target nucleic acids are NOT DETECTED. The SARS-CoV-2 RNA is generally detectable in upper and lower  respiratory specimens during the acute phase of infection. The lowest  concentration of SARS-CoV-2 viral copies this assay can detect is 250  copies / mL. A negative result does not preclude SARS-CoV-2 infection  and should not be used as the sole basis for treatment or other  patient management decisions.  A negative result may occur with  improper specimen collection / handling, submission of specimen other  than nasopharyngeal swab, presence of viral mutation(s) within the  areas targeted by this assay, and inadequate number of viral copies  (<250 copies / mL). A negative result must be combined with clinical  observations, patient history, and epidemiological information. If result is POSITIVE SARS-CoV-2 target nucleic acids are DETECTED. The SARS-CoV-2 RNA is generally detectable in upper and lower  respiratory specimens dur ing the acute phase of infection.  Positive  results  are indicative of active infection with SARS-CoV-2.  Clinical  correlation with patient history and other diagnostic information is  necessary to determine patient infection status.  Positive results do  not rule out bacterial infection or co-infection with other viruses. If result is PRESUMPTIVE POSTIVE SARS-CoV-2 nucleic acids MAY BE PRESENT.   A presumptive positive result was obtained on the submitted specimen  and confirmed on repeat testing.  While 2019 novel coronavirus  (SARS-CoV-2) nucleic acids may be present in the submitted sample  additional confirmatory testing may be necessary for epidemiological  and / or clinical management purposes  to differentiate between  SARS-CoV-2 and other Sarbecovirus currently known to infect humans.  If clinically indicated additional testing with an alternate test  methodology [phone]) is advised. The SARS-CoV-2 RNA is generally  detectable in upper and lower respiratory sp ecimens during the  acute  phase of infection. The expected result is Negative. Fact Sheet for Patients:  StrictlyIdeas.no Fact Sheet for Healthcare Providers: BankingDealers.co.za This test is not yet approved or cleared by the [place] FDA and has been authorized for detection and/or diagnosis of SARS-CoV-2 by FDA under an Emergency Use Authorization (EUA).  This EUA will remain in effect (meaning this test can be used) for the duration of the COVID-19 declaration under Section 564(b)(1) of the Act, 21 U.S.C. section 360bbb-3(b)(1), unless the authorization is terminated or revoked sooner. Performed at [facility], [address] [place], [place] [address]     RADIOLOGY:  No results found.   Management plans discussed with the patient, family and they are in agreement.  CODE STATUS: DNR   TOTAL TIME TAKING CARE OF THIS PATIENT: 37 minutes.    Moni Rothrock M.D on 10/14/2019 at 10:41 AM  Between  7am to 6pm - Pager - [phone]  After 6pm go to [email or web address] - Proofreader  [facility]  Office  [phone]  CC: Primary care physician; Valerie Roys, DO   Note: This dictation was prepared with Dragon dictation along with smaller phrase technology. Any transcriptional errors that result from this process are unintentional.

## 2019-10-14 NOTE — Progress Notes (Signed)
Follow up visit made to new referral for [facility] hospice home. Patient seen lying in bed, respirations shallow. Continuous morphine at 2 mg/hr, no boluses given overnight.  Daughter Tammy at bedside. Emotional support given. Consents completed. Discharge summary faxed to referral, report called to the hospice home, EMS notified for transport. Tammy and hospital care team updated. Flo Shanks BSN, RN, [place] [phone]

## 2019-10-15 ENCOUNTER — Telehealth: Payer: Self-pay | Admitting: Family Medicine

## 2019-10-17 NOTE — Telephone Encounter (Signed)
Patient's daughter notified.

## 2019-10-17 NOTE — Telephone Encounter (Signed)
Copied from [place] [id number]. Topic: General - Deceased Patient >> 2019/11/14  2:06 PM [name] wrote: PT's daughter called requesting call back from PCP, Pt passed away this morning Lorraine Vargas  [phone]

## 2019-10-17 NOTE — Telephone Encounter (Signed)
Please let Tammy know that I'm so sorry and to let us know if there is anything she needs.

## 2019-10-17 NOTE — Telephone Encounter (Signed)
Copied from [place] [id number]. Topic: General - Deceased Patient >> 11-13-2019  2:06 PM [name] wrote: PT's daughter called requesting call back from PCP, Pt passed away this morning Lorraine Vargas  [phone]

## 2019-10-17 DEATH — deceased

## 2019-10-19 ENCOUNTER — Ambulatory Visit: Payer: Medicare Other | Admitting: Family Medicine

## 2021-01-24 IMAGING — CR LEFT HUMERUS - 2+ VIEW
1 series · 3 of 3 positions shown · non-contrast
Comparison: None.

CLINICAL DATA: Pain in mid to distal LEFT humerus. No injury.
History of cancer removal from LEFT humerus.

EXAM:
LEFT HUMERUS - 2+ VIEW

[Series 1: dg forearm left · 0.14mm/px · 3 of 3 slices shown]
[im 1/3]
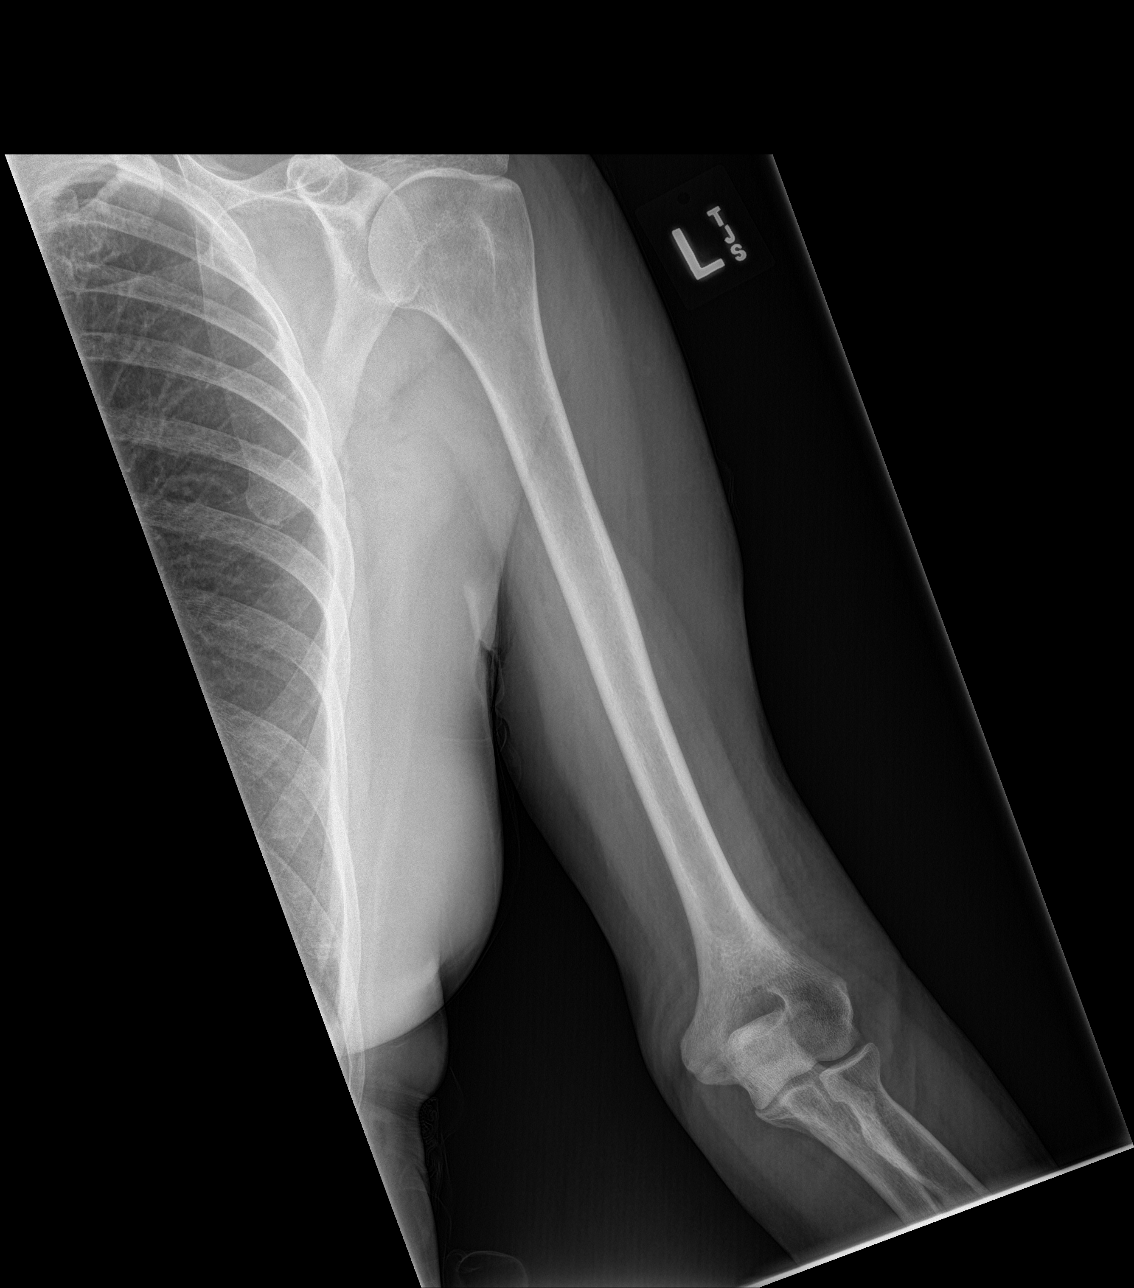
[im 2/3]
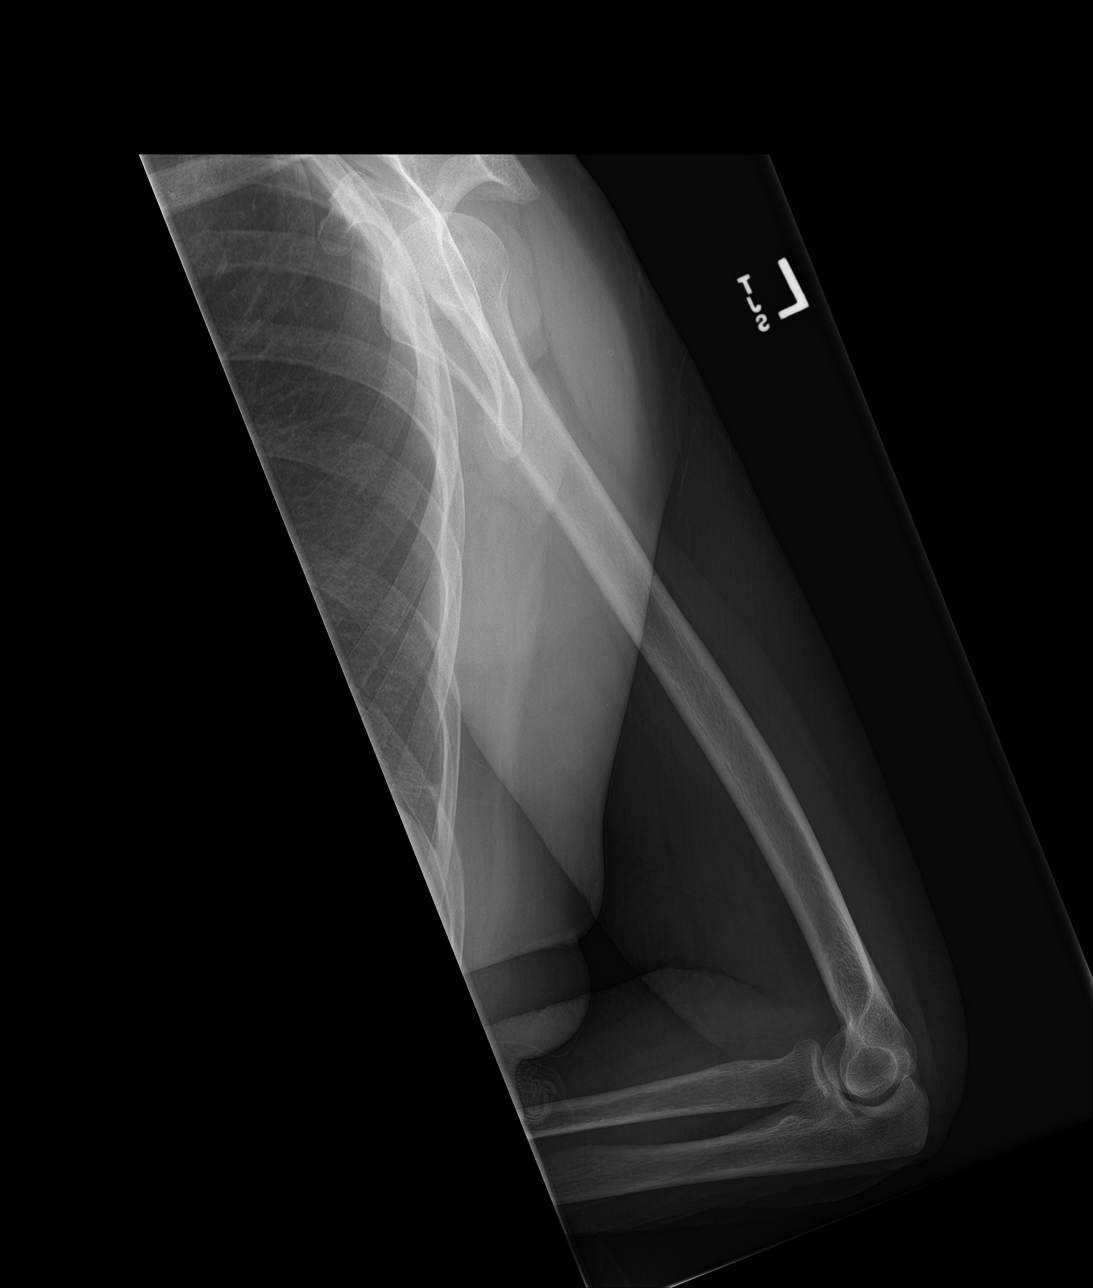
[im 3/3]
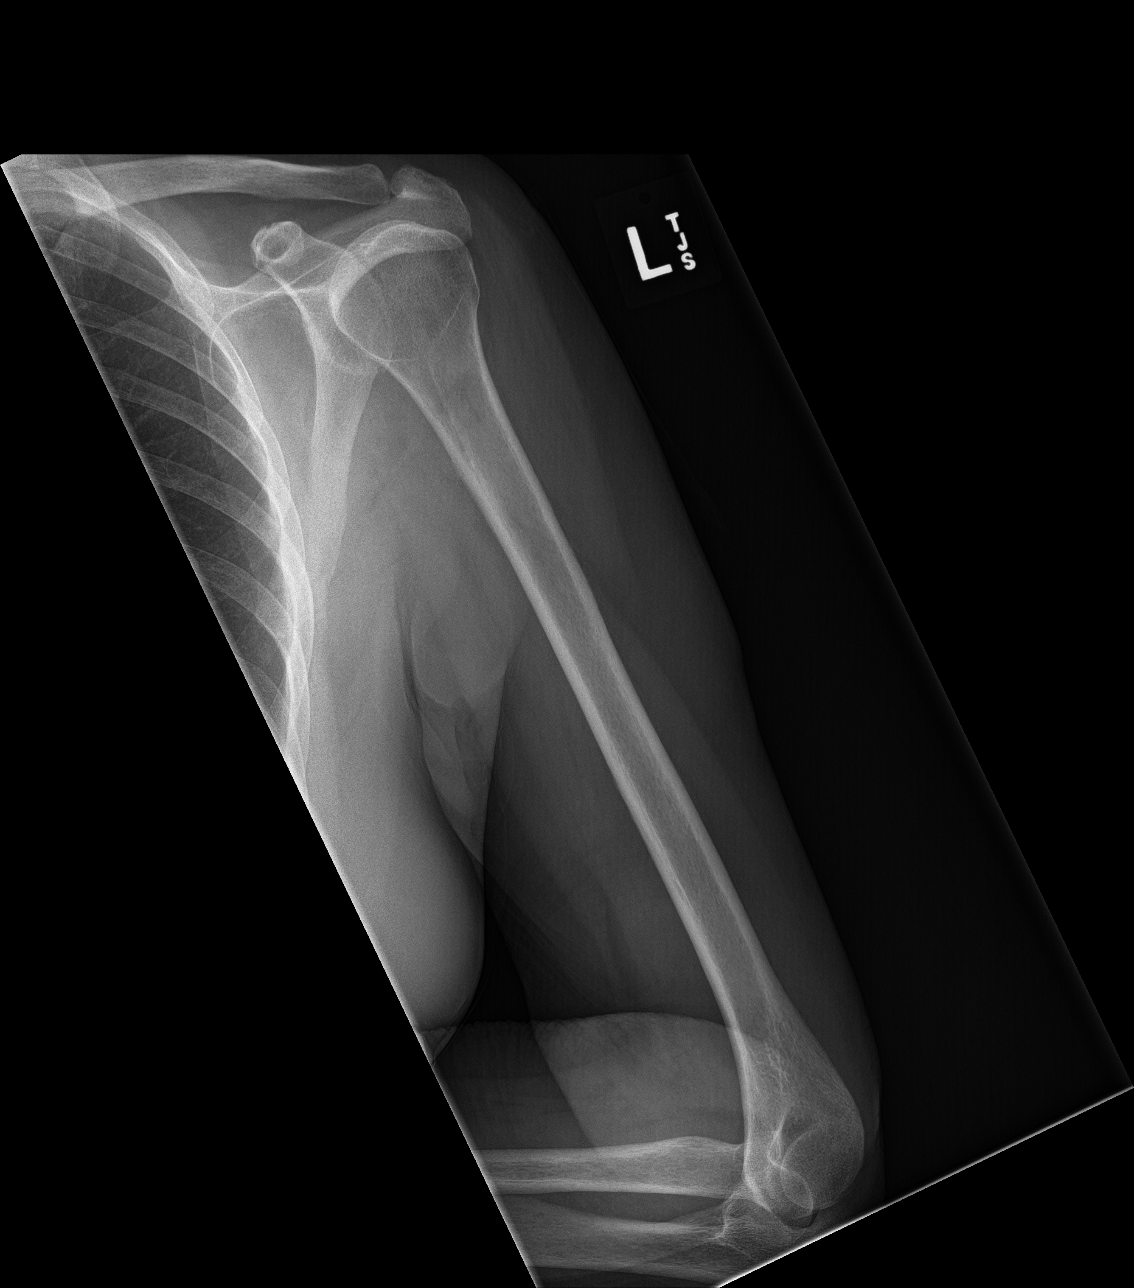

[3 of 3 positions shown; findings below may reference images not displayed]

FINDINGS: Osseous alignment is normal. Bone mineralization is normal. No
fracture line or displaced fracture fragment seen. No acute or
suspicious osseous lesion. Soft tissues about the LEFT humerus are
unremarkable.
IMPRESSION: Negative.
